# Patient Record
Sex: Male | Born: 1965
Health system: Southern US, Community
[De-identification: ages and names within clinical notes are randomized; demographics above are authoritative.]

## PROBLEM LIST (undated history)

## (undated) DIAGNOSIS — R05 Cough: Secondary | ICD-10-CM

## (undated) DIAGNOSIS — R059 Cough, unspecified: Secondary | ICD-10-CM

## (undated) DIAGNOSIS — I10 Essential (primary) hypertension: Secondary | ICD-10-CM

## (undated) DIAGNOSIS — T7840XA Allergy, unspecified, initial encounter: Secondary | ICD-10-CM

## (undated) DIAGNOSIS — E669 Obesity, unspecified: Secondary | ICD-10-CM

## (undated) DIAGNOSIS — R0602 Shortness of breath: Secondary | ICD-10-CM

## (undated) HISTORY — DX: Allergy, unspecified, initial encounter: T78.40XA

## (undated) HISTORY — PX: WISDOM TOOTH EXTRACTION: SHX21

## (undated) HISTORY — PX: KNEE ARTHROSCOPY: SUR90

## (undated) HISTORY — DX: Cough, unspecified: R05.9

## (undated) HISTORY — DX: Shortness of breath: R06.02

## (undated) HISTORY — DX: Obesity, unspecified: E66.9

## (undated) HISTORY — DX: Essential (primary) hypertension: I10

---
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
## 2011-01-10 ENCOUNTER — Encounter: Payer: Self-pay | Admitting: Family Medicine

## 2011-01-10 ENCOUNTER — Ambulatory Visit (INDEPENDENT_AMBULATORY_CARE_PROVIDER_SITE_OTHER): Payer: PRIVATE HEALTH INSURANCE | Admitting: Family Medicine

## 2011-01-10 DIAGNOSIS — Z0000 Encounter for general adult medical examination without abnormal findings: Secondary | ICD-10-CM

## 2011-01-10 DIAGNOSIS — M109 Gout, unspecified: Secondary | ICD-10-CM

## 2011-01-10 LAB — CBC WITH DIFFERENTIAL/PLATELET
Basophils Absolute: 0 10*3/uL (ref 0.0–0.1)
Basophils Relative: 0.6 % (ref 0.0–3.0)
Eosinophils Absolute: 0.3 10*3/uL (ref 0.0–0.7)
Hemoglobin: 16.8 g/dL (ref 13.0–17.0)
Lymphocytes Relative: 29.4 % (ref 12.0–46.0)
MCHC: 34.5 g/dL (ref 30.0–36.0)
Monocytes Relative: 6.8 % (ref 3.0–12.0)
Neutro Abs: 3.9 10*3/uL (ref 1.4–7.7)
Neutrophils Relative %: 59.2 % (ref 43.0–77.0)
RBC: 5.22 Mil/uL (ref 4.22–5.81)

## 2011-01-10 LAB — POCT URINALYSIS DIPSTICK
Ketones, UA: NEGATIVE
Leukocytes, UA: NEGATIVE
Nitrite, UA: NEGATIVE
Urobilinogen, UA: 0.2
pH, UA: 6

## 2011-01-10 LAB — LIPID PANEL
HDL: 41.9 mg/dL (ref >39.00)
Total CHOL/HDL Ratio: 5
VLDL: 31.4 mg/dL (ref 0.0–40.0)

## 2011-01-10 LAB — BASIC METABOLIC PANEL
Calcium: 8.8 mg/dL (ref 8.4–10.5)
Creatinine, Ser: 1.2 mg/dL (ref 0.4–1.5)
GFR: 66.82 mL/min (ref >60.00)
Sodium: 141 mEq/L (ref 135–145)

## 2011-01-10 LAB — HEPATIC FUNCTION PANEL
AST: 31 U/L (ref 0–37)
Albumin: 4.5 g/dL (ref 3.5–5.2)
Alkaline Phosphatase: 51 U/L (ref 39–117)
Bilirubin, Direct: 0.1 mg/dL (ref 0.0–0.3)

## 2011-01-10 LAB — TSH: TSH: 1.41 u[IU]/mL (ref 0.35–5.50)

## 2011-01-10 MED ORDER — PREDNISONE 20 MG PO TABS: ORAL_TABLET | ORAL | Status: DC

## 2011-01-10 MED ORDER — HYDROCODONE-ACETAMINOPHEN 7.5-750 MG PO TABS: ORAL_TABLET | ORAL | Status: DC

## 2011-01-10 MED ORDER — ALLOPURINOL 100 MG PO TABS: 100.0000 mg | ORAL_TABLET | Freq: Every day | ORAL | Status: DC

## 2011-01-10 NOTE — Patient Instructions (Signed)
I will call you when we get her lab reports back to discuss any medication that we might give you to prevent the gouty attacks.  Return annually for general physical examination  We do see children and families so if the rest of your family would like to see [place] would be happy to do that

## 2011-01-10 NOTE — Progress Notes (Signed)
  Subjective:    Patient ID: Steven Peters, male    DOB: 07/17/1965, 45 y.o.   MRN: [id number]  Jarboe is a 45 year old, married male, nonsmoker, who comes in today as a new patient.  As always been excellent health.  Has never been hospitalized overnight.  He had surgery to his right knee torn cartilage.  Years ago.  He history gout with starting allopurinol, however, when he started ill-appearing all precipitated more gouty attacks.  Therefore, he stopped it.  He's one to two gouty attacks per month.  Review of systems negative except for seasonal allergic rhinitis.  Social history he is married 88 children 41-year-old and 62-year-old twins.  He enjoys playing golf.  He works in [employer].  Family history unknown.  He was adopted.  Immunizations up-to-date because of foreign travel with his job.    Review of Systems  Constitutional: Negative.   HENT: Negative.   Eyes: Negative.   Respiratory: Negative.   Cardiovascular: Negative.   Gastrointestinal: Negative.   Genitourinary: Negative.   Musculoskeletal: Negative.   Skin: Negative.   Neurological: Negative.   Hematological: Negative.   Psychiatric/Behavioral: Negative.        Objective:   Physical Exam  Constitutional: He is oriented to person, place, and time. He appears well-developed and well-nourished.  HENT:  Head: Normocephalic and atraumatic.  Right Ear: External ear normal.  Left Ear: External ear normal.  Nose: Nose normal.  Mouth/Throat: Oropharynx is clear and moist.  Eyes: Conjunctivae and EOM are normal. Pupils are equal, round, and reactive to light.  Neck: Normal range of motion. Neck supple. No JVD present. No tracheal deviation present. No thyromegaly present.  Cardiovascular: Normal rate, regular rhythm, normal heart sounds and intact distal pulses.  Exam reveals no gallop and no friction rub.   No murmur heard. Pulmonary/Chest: Effort normal and breath sounds normal. No stridor. No  respiratory distress. He has no wheezes. He has no rales. He exhibits no tenderness.  Abdominal: Soft. Bowel sounds are normal. He exhibits no distension and no mass. There is no tenderness. There is no rebound and no guarding.  Genitourinary: Rectum normal, prostate normal and penis normal. Guaiac negative stool. No penile tenderness.  Musculoskeletal: Normal range of motion. He exhibits no edema and no tenderness.  Lymphadenopathy:    He has no cervical adenopathy.  Neurological: He is alert and oriented to person, place, and time. He has normal reflexes. No cranial nerve deficit. He exhibits normal muscle tone.  Skin: Skin is warm and dry. No rash noted. No erythema. No pallor.  Psychiatric: He has a normal mood and affect. His behavior is normal. Judgment and thought content normal.          Assessment & Plan:  At healthy male.  Over weight.  History of gout.  Check labs.  Recommend diet, exercise and weight loss.  Return for annual exam

## 2011-01-14 ENCOUNTER — Telehealth: Payer: Self-pay | Admitting: *Deleted

## 2011-01-14 NOTE — Telephone Encounter (Signed)
Pt would like lab results.  

## 2011-01-17 NOTE — Telephone Encounter (Signed)
patient  Is aware and copy mailed to home address

## 2011-01-17 NOTE — Telephone Encounter (Signed)
Please call labs okay, except uric acid, markedly elevated.  He needs to begin taking allopurinol again, one tablet daily forever

## 2011-06-13 ENCOUNTER — Telehealth: Payer: Self-pay

## 2011-06-13 NOTE — Telephone Encounter (Signed)
Pt has been experiencing pain in his left leg when standing or sitting.  Pt states when he is standing it is worse and his toes go numb. This has been going on for 2 to 3 months.   Pt would like to be worked in this week.  Pt can be reached at [phone].

## 2011-06-14 NOTE — Telephone Encounter (Signed)
Appt made for 12/12

## 2011-06-15 ENCOUNTER — Ambulatory Visit (INDEPENDENT_AMBULATORY_CARE_PROVIDER_SITE_OTHER)
Admission: RE | Admit: 2011-06-15 | Discharge: 2011-06-15 | Disposition: A | Discharge status: Home / Self Care | Payer: PRIVATE HEALTH INSURANCE | Source: Ambulatory Visit | Attending: Family Medicine | Admitting: Family Medicine

## 2011-06-15 ENCOUNTER — Encounter: Payer: Self-pay | Admitting: Family Medicine

## 2011-06-15 ENCOUNTER — Ambulatory Visit (INDEPENDENT_AMBULATORY_CARE_PROVIDER_SITE_OTHER): Payer: PRIVATE HEALTH INSURANCE | Admitting: Family Medicine

## 2011-06-15 VITALS — BP 110/80 | Temp 98.0°F | Wt 289.0 lb

## 2011-06-15 DIAGNOSIS — M169 Osteoarthritis of hip, unspecified: Secondary | ICD-10-CM

## 2011-06-15 DIAGNOSIS — M25559 Pain in unspecified hip: Secondary | ICD-10-CM

## 2011-06-15 DIAGNOSIS — M25552 Pain in left hip: Secondary | ICD-10-CM | POA: Insufficient documentation

## 2011-06-15 HISTORY — DX: Pain in left hip: M25.552

## 2011-06-15 NOTE — Progress Notes (Signed)
  Subjective:    Patient ID: Steven Peters, male    DOB: June 09, 1966, 45 y.o.   MRN: [id number]  HPI Clifford is a 45 year old, married male, nonsmoker, who comes in with a two-month history of atraumatic left hip pain.  He states about two months ago he began having pain in his left hip with walking.  When he stops walking.  The pain goes away.  No previous history of trauma.  He did play soccer basketball and tennis in high school.  His right footed.  Review of systems negative.  Family history negative   Review of Systems General an orthopedic review of systems otherwise negative    Objective:   Physical Exam  Well-developed well-nourished, male in no acute distress.  Examination of the right hip shows normal sensation, strength, and reflexes.  However, he is only able to externally rotate his right hip about 45 degrees.  Left leg appears normal.  Sensation, strength, and reflexes.  Normal is only able to elevate the left leg about 30 degrees and very minimal extra rotation      Assessment & Plan:  Degenerative joint disease left hip.  Plan x-ray today.  Begin Motrin or so consult ASAP

## 2011-06-15 NOTE — Patient Instructions (Signed)
Go to the main office now for your x-rays.  Motrin 600 mg twice daily with food.  Call [facility] and make an appointment to see Dr. Cleophas Dunker or Dr. Cleophas Dunker for consultation

## 2013-05-02 ENCOUNTER — Telehealth: Payer: Self-pay | Admitting: Family Medicine

## 2013-05-02 NOTE — Telephone Encounter (Signed)
Spoke with patient and an appointment made 

## 2013-05-02 NOTE — Telephone Encounter (Signed)
Pt has pain going down the back of his leg from waist to foot. Pain has progressively gotten worse over the past 2 wks.  [place] going [place] of country on [place]. Sometimes pain so bad he has to sit down. Wants to come in today/ pls advise

## 2013-05-03 ENCOUNTER — Ambulatory Visit (INDEPENDENT_AMBULATORY_CARE_PROVIDER_SITE_OTHER): Payer: PRIVATE HEALTH INSURANCE | Admitting: Family

## 2013-05-03 ENCOUNTER — Encounter: Payer: Self-pay | Admitting: Family

## 2013-05-03 VITALS — BP 118/72 | HR 94 | Wt 297.0 lb

## 2013-05-03 DIAGNOSIS — M545 Low back pain, unspecified: Secondary | ICD-10-CM

## 2013-05-03 DIAGNOSIS — M5416 Radiculopathy, lumbar region: Secondary | ICD-10-CM

## 2013-05-03 DIAGNOSIS — Z23 Encounter for immunization: Secondary | ICD-10-CM

## 2013-05-03 DIAGNOSIS — IMO0002 Reserved for concepts with insufficient information to code with codable children: Secondary | ICD-10-CM

## 2013-05-03 MED ORDER — PREDNISONE 20 MG PO TABS: ORAL_TABLET | ORAL | Status: AC

## 2013-05-03 NOTE — Patient Instructions (Signed)
Back Exercises These exercises may help you when beginning to rehabilitate your injury. Your symptoms may resolve with or without further involvement from your physician, physical therapist or athletic trainer. While completing these exercises, remember:   Restoring tissue flexibility helps normal motion to return to the joints. This allows healthier, less painful movement and activity.  An effective stretch should be held for at least 30 seconds.  A stretch should never be painful. You should only feel a gentle lengthening or release in the stretched tissue. STRETCH  Extension, Prone on Elbows   Lie on your stomach on the floor, a bed will be too soft. Place your palms about shoulder width apart and at the height of your head.  Place your elbows under your shoulders. If this is too painful, stack pillows under your chest.  Allow your body to relax so that your hips drop lower and make contact more completely with the floor.  Hold this position for __________ seconds.  Slowly return to lying flat on the floor. Repeat __________ times. Complete this exercise __________ times per day.  RANGE OF MOTION  Extension, Prone Press Ups   Lie on your stomach on the floor, a bed will be too soft. Place your palms about shoulder width apart and at the height of your head.  Keeping your back as relaxed as possible, slowly straighten your elbows while keeping your hips on the floor. You may adjust the placement of your hands to maximize your comfort. As you gain motion, your hands will come more underneath your shoulders.  Hold this position __________ seconds.  Slowly return to lying flat on the floor. Repeat __________ times. Complete this exercise __________ times per day.  RANGE OF MOTION- Quadruped, Neutral Spine   Assume a hands and knees position on a firm surface. Keep your hands under your shoulders and your knees under your hips. You may place padding under your knees for comfort.  Drop  your head and point your tail bone toward the ground below you. This will round out your low back like an angry cat. Hold this position for __________ seconds.  Slowly lift your head and release your tail bone so that your back sags into a large arch, like an old horse.  Hold this position for __________ seconds.  Repeat this until you feel limber in your low back.  Now, find your "sweet spot." This will be the most comfortable position somewhere between the two previous positions. This is your neutral spine. Once you have found this position, tense your stomach muscles to support your low back.  Hold this position for __________ seconds. Repeat __________ times. Complete this exercise __________ times per day.  STRETCH  Flexion, Single Knee to Chest   Lie on a firm bed or floor with both legs extended in front of you.  Keeping one leg in contact with the floor, bring your opposite knee to your chest. Hold your leg in place by either grabbing behind your thigh or at your knee.  Pull until you feel a gentle stretch in your low back. Hold __________ seconds.  Slowly release your grasp and repeat the exercise with the opposite side. Repeat __________ times. Complete this exercise __________ times per day.  STRETCH - Hamstrings, Standing  Stand or sit and extend your right / left leg, placing your foot on a chair or foot stool  Keeping a slight arch in your low back and your hips straight forward.  Lead with your chest and   lean forward at the waist until you feel a gentle stretch in the back of your right / left knee or thigh. (When done correctly, this exercise requires leaning only a small distance.)  Hold this position for __________ seconds. Repeat __________ times. Complete this stretch __________ times per day. STRENGTHENING  Deep Abdominals, Pelvic Tilt   Lie on a firm bed or floor. Keeping your legs in front of you, bend your knees so they are both pointed toward the ceiling and  your feet are flat on the floor.  Tense your lower abdominal muscles to press your low back into the floor. This motion will rotate your pelvis so that your tail bone is scooping upwards rather than pointing at your feet or into the floor.  With a gentle tension and even breathing, hold this position for __________ seconds. Repeat __________ times. Complete this exercise __________ times per day.  STRENGTHENING  Abdominals, Crunches   Lie on a firm bed or floor. Keeping your legs in front of you, bend your knees so they are both pointed toward the ceiling and your feet are flat on the floor. Cross your arms over your chest.  Slightly tip your chin down without bending your neck.  Tense your abdominals and slowly lift your trunk high enough to just clear your shoulder blades. Lifting higher can put excessive stress on the low back and does not further strengthen your abdominal muscles.  Control your return to the starting position. Repeat __________ times. Complete this exercise __________ times per day.  STRENGTHENING  Quadruped, Opposite UE/LE Lift   Assume a hands and knees position on a firm surface. Keep your hands under your shoulders and your knees under your hips. You may place padding under your knees for comfort.  Find your neutral spine and gently tense your abdominal muscles so that you can maintain this position. Your shoulders and hips should form a rectangle that is parallel with the floor and is not twisted.  Keeping your trunk steady, lift your right hand no higher than your shoulder and then your left leg no higher than your hip. Make sure you are not holding your breath. Hold this position __________ seconds.  Continuing to keep your abdominal muscles tense and your back steady, slowly return to your starting position. Repeat with the opposite arm and leg. Repeat __________ times. Complete this exercise __________ times per day. Document Released: 07/08/2005 Document  Revised: 09/12/2011 Document Reviewed: 10/02/2008 [employer] Patient Information 2014 [employer].  

## 2013-05-03 NOTE — Progress Notes (Signed)
  Subjective:    Patient ID: Steven Peters, male    DOB: [birth year], 47 y.o.   MRN: [id number]  HPI  47 year old white male, nonsmoker, patient of Dr. Tawanna Cooler is in today with complaints of left lower back pain that radiates down his left leg into his calf x2 weeks and worsening. He rates the pain 8/10, worse with certain movement. He hasn't taken ibuprofen 800 mg that has not helped. The pain is worse this week that had been previously. Patient reports movingonth ago. Denies any history of back pain or injury in the past.  Review of Systems  Constitutional: Negative.   Respiratory: Negative.   Cardiovascular: Negative.   Genitourinary: Negative.   Musculoskeletal: Positive for back pain.       Radiates down the left leg  Skin: Negative.   Neurological: Negative.   Psychiatric/Behavioral: Negative.    Past Medical History  Diagnosis Date  . Allergy   . Gout     History   Social History  . Marital Status: Married    Spouse Name: N/A    Number of Children: N/A  . Years of Education: N/A   Occupational History  . Not on file.   Social History Main Topics  . Smoking status: Former Games developer  . Smokeless tobacco: Not on file  . Alcohol Use: No  . Drug Use: No  . Sexual Activity:    Other Topics Concern  . Not on file   Social History Narrative  . No narrative on file    History reviewed. No pertinent past surgical history.  History reviewed. No pertinent family history.  Not on File  Current Outpatient Prescriptions on File Prior to Visit  Medication Sig Dispense Refill  . allopurinol (ZYLOPRIM) 300 MG tablet Take 300 mg by mouth daily.        Marland Kitchen allopurinol (ZYLOPRIM) 100 MG tablet Take 1 tablet (100 mg total) by mouth daily.  100 tablet  3  . colchicine 0.6 MG tablet Take 0.6 mg by mouth daily.        Marland Kitchen HYDROcodone-acetaminophen (VICODIN ES) 7.5-750 MG per tablet One half to one tablet every 4 to 6 hours p.r.n. For gout  30 tablet  1   No current  facility-administered medications on file prior to visit.    BP 118/72  Pulse 94  Wt 297 lb (134.718 kg)  BMI 41.12 kg/m2chart    Objective:   Physical Exam  Constitutional: He is oriented to person, place, and time. He appears well-developed and well-nourished.  Neck: Normal range of motion. Neck supple. No thyromegaly present.  Cardiovascular: Normal rate, regular rhythm and normal heart sounds.   Pulmonary/Chest: Effort normal and breath sounds normal.  Musculoskeletal: Normal range of motion.  Neurological: He is alert and oriented to person, place, and time.  Skin: Skin is warm and dry.  Psychiatric: He has a normal mood and affect.          Assessment & Plan:  Assessment: 1. Lumbar radiculopathy 2. Low back pain  Plan: Prednisone 60x3, 40x3, 20x3. Consider physical therapy. Low back strengthening exercises to strengthen the core and decreased back pain. In the distant future we could consider an MRI if necessary.

## 2013-09-30 ENCOUNTER — Encounter: Payer: PRIVATE HEALTH INSURANCE | Admitting: Family Medicine

## 2013-10-28 ENCOUNTER — Encounter: Payer: PRIVATE HEALTH INSURANCE | Admitting: Family Medicine

## 2013-11-11 ENCOUNTER — Other Ambulatory Visit: Payer: Self-pay | Admitting: Family

## 2014-03-17 ENCOUNTER — Telehealth: Payer: Self-pay | Admitting: Family Medicine

## 2014-03-17 NOTE — Telephone Encounter (Signed)
ok 

## 2014-03-17 NOTE — Telephone Encounter (Signed)
Fine with me if ok with Dr. Sherren Mocha.

## 2014-03-17 NOTE — Telephone Encounter (Signed)
Pt would like to transfer from Dr. Sherren Mocha to Dr. Yong Channel. Please advise if okay.

## 2014-03-18 NOTE — Telephone Encounter (Signed)
Pt is scheduled °

## 2014-03-31 ENCOUNTER — Ambulatory Visit: Payer: PRIVATE HEALTH INSURANCE | Admitting: Family Medicine

## 2014-08-18 ENCOUNTER — Other Ambulatory Visit (INDEPENDENT_AMBULATORY_CARE_PROVIDER_SITE_OTHER): Payer: PRIVATE HEALTH INSURANCE

## 2014-08-18 DIAGNOSIS — Z0000 Encounter for general adult medical examination without abnormal findings: Secondary | ICD-10-CM

## 2014-08-18 LAB — CBC WITH DIFFERENTIAL/PLATELET
BASOS PCT: 0.6 % (ref 0.0–3.0)
Basophils Absolute: 0 10*3/uL (ref 0.0–0.1)
EOS ABS: 0.2 10*3/uL (ref 0.0–0.7)
EOS PCT: 3.2 % (ref 0.0–5.0)
HCT: 52.3 % — ABNORMAL HIGH (ref 39.0–52.0)
Hemoglobin: 17.9 g/dL — ABNORMAL HIGH (ref 13.0–17.0)
LYMPHS PCT: 28.2 % (ref 12.0–46.0)
Lymphs Abs: 1.9 10*3/uL (ref 0.7–4.0)
MCHC: 34.2 g/dL (ref 30.0–36.0)
MCV: 92.3 fl (ref 78.0–100.0)
MONO ABS: 0.5 10*3/uL (ref 0.1–1.0)
Monocytes Relative: 6.6 % (ref 3.0–12.0)
Neutro Abs: 4.2 10*3/uL (ref 1.4–7.7)
Neutrophils Relative %: 61.4 % (ref 43.0–77.0)
Platelets: 357 10*3/uL (ref 150.0–400.0)
RBC: 5.67 Mil/uL (ref 4.22–5.81)
RDW: 15.1 % (ref 11.5–15.5)
WBC: 6.8 10*3/uL (ref 4.0–10.5)

## 2014-08-18 LAB — BASIC METABOLIC PANEL
BUN: 12 mg/dL (ref 6–23)
CALCIUM: 9.4 mg/dL (ref 8.4–10.5)
CHLORIDE: 104 meq/L (ref 96–112)
CO2: 30 mEq/L (ref 19–32)
CREATININE: 1.17 mg/dL (ref 0.40–1.50)
GFR: 70.36 mL/min (ref >60.00)
GLUCOSE: 82 mg/dL (ref 70–99)
Potassium: 4.1 mEq/L (ref 3.5–5.1)
Sodium: 138 mEq/L (ref 135–145)

## 2014-08-18 LAB — POCT URINALYSIS DIPSTICK
Bilirubin, UA: NEGATIVE
Glucose, UA: NEGATIVE
KETONES UA: NEGATIVE
LEUKOCYTES UA: NEGATIVE
NITRITE UA: NEGATIVE
PROTEIN UA: NEGATIVE
RBC UA: NEGATIVE
Spec Grav, UA: 1.015
Urobilinogen, UA: 0.2
pH, UA: 5.5

## 2014-08-18 LAB — TSH: TSH: 2.39 u[IU]/mL (ref 0.35–4.50)

## 2014-08-18 LAB — HEPATIC FUNCTION PANEL
ALK PHOS: 60 U/L (ref 39–117)
ALT: 25 U/L (ref 0–53)
AST: 18 U/L (ref 0–37)
Albumin: 4.1 g/dL (ref 3.5–5.2)
BILIRUBIN DIRECT: 0.2 mg/dL (ref 0.0–0.3)
BILIRUBIN TOTAL: 0.8 mg/dL (ref 0.2–1.2)
Total Protein: 7.4 g/dL (ref 6.0–8.3)

## 2014-08-18 LAB — LIPID PANEL
Cholesterol: 169 mg/dL (ref 0–200)
HDL: 37.6 mg/dL — ABNORMAL LOW (ref >39.00)
LDL CALC: 105 mg/dL — AB (ref 0–99)
NonHDL: 131.4
Total CHOL/HDL Ratio: 4
Triglycerides: 134 mg/dL (ref 0.0–149.0)
VLDL: 26.8 mg/dL (ref 0.0–40.0)

## 2014-08-26 ENCOUNTER — Ambulatory Visit (INDEPENDENT_AMBULATORY_CARE_PROVIDER_SITE_OTHER): Payer: PRIVATE HEALTH INSURANCE | Admitting: Family Medicine

## 2014-08-26 ENCOUNTER — Encounter: Payer: Self-pay | Admitting: Family Medicine

## 2014-08-26 VITALS — BP 110/84 | Temp 97.9°F | Ht 71.5 in | Wt 299.0 lb

## 2014-08-26 DIAGNOSIS — Z8639 Personal history of other endocrine, nutritional and metabolic disease: Secondary | ICD-10-CM | POA: Diagnosis not present

## 2014-08-26 DIAGNOSIS — Z0000 Encounter for general adult medical examination without abnormal findings: Secondary | ICD-10-CM | POA: Insufficient documentation

## 2014-08-26 DIAGNOSIS — Z8739 Personal history of other diseases of the musculoskeletal system and connective tissue: Secondary | ICD-10-CM

## 2014-08-26 DIAGNOSIS — Z23 Encounter for immunization: Secondary | ICD-10-CM

## 2014-08-26 DIAGNOSIS — D751 Secondary polycythemia: Secondary | ICD-10-CM

## 2014-08-26 HISTORY — DX: Personal history of other diseases of the musculoskeletal system and connective tissue: Z87.39

## 2014-08-26 LAB — IRON: IRON: 69 ug/dL (ref 42–165)

## 2014-08-26 LAB — FERRITIN: Ferritin: 301.3 ng/mL (ref 22.0–322.0)

## 2014-08-26 MED ORDER — ALLOPURINOL 300 MG PO TABS: 300.0000 mg | ORAL_TABLET | Freq: Every day | ORAL | Status: DC

## 2014-08-26 NOTE — Progress Notes (Signed)
   Subjective:    Patient ID: Steven Peters, male    DOB: Nov 25, 1965, 49 y.o.   MRN: [id number]  HPI [name] is a 49 year old nonsmoking male who comes in today for general physical exam because of a history of gout  He takes allopurinol 3 mg daily and he said no gouty attacks in 2 years on this dose.  Does not get routine eye care recommended Dr. Bing Plume. Also needs regular dental care. Colonoscopy at age 43. No family history,,,,,,,,, unknown,,,,,,,,,,,,, adopted  Tetanus booster and flu shot given today,  He is married 59 children 17-year-old twins and 75 year old. He travels he works in a [place]: Negative.   HENT: Negative.   Eyes: Negative.   Respiratory: Negative.   Cardiovascular: Negative.   Gastrointestinal: Negative.   Endocrine: Negative.   Genitourinary: Negative.   Musculoskeletal: Negative.   Skin: Negative.   Allergic/Immunologic: Negative.   Neurological: Negative.   Hematological: Negative.   Psychiatric/Behavioral: Negative.        Objective:   Physical Exam  Constitutional: He is oriented to person, place, and time. He appears well-developed and well-nourished.  HENT:  Head: Normocephalic and atraumatic.  Right Ear: External ear normal.  Left Ear: External ear normal.  Nose: Nose normal.  Mouth/Throat: Oropharynx is clear and moist.  Eyes: Conjunctivae and EOM are normal. Pupils are equal, round, and reactive to light.  Neck: Normal range of motion. Neck supple. No JVD present. No tracheal deviation present. No thyromegaly present.  Cardiovascular: Normal rate, regular rhythm, normal heart sounds and intact distal pulses.  Exam reveals no gallop and no friction rub.   No murmur heard. Pulmonary/Chest: Effort normal and breath sounds normal. No stridor. No respiratory distress. He has no wheezes. He has no rales. He exhibits no tenderness.  Abdominal: Soft. Bowel sounds are normal. He exhibits no distension  and no mass. There is no tenderness. There is no rebound and no guarding.  Genitourinary: Rectum normal, prostate normal and penis normal. Guaiac negative stool. No penile tenderness.  Musculoskeletal: Normal range of motion. He exhibits no edema or tenderness.  Lymphadenopathy:    He has no cervical adenopathy.  Neurological: He is alert and oriented to person, place, and time. He has normal reflexes. No cranial nerve deficit. He exhibits normal muscle tone.  Skin: Skin is warm and dry. No rash noted. No erythema. No pallor.  Psychiatric: He has a normal mood and affect. His behavior is normal. Judgment and thought content normal.  Nursing note and vitals reviewed.         Assessment & Plan:Healthy male    Healthy male  History of gout.... Continue allopurinol  Overweight....... 299 pounds.........Marland Kitchen recommend diet exercise and weight loss follow-up in one year

## 2014-08-26 NOTE — Addendum Note (Signed)
Addended by: Westley Hummer B on: 08/26/2014 05:02 PM   Modules accepted: Orders

## 2014-08-26 NOTE — Patient Instructions (Addendum)
Allopurinol 300 mg.......Marland Kitchen 1 daily  Begin a diet and exercise program........... walk 15 minutes daily........ avoid sugar  Donated a unit of blood every 4 months  Labs today

## 2014-08-26 NOTE — Progress Notes (Signed)
Pre visit review using our clinic review tool, if applicable. No additional management support is needed unless otherwise documented below in the visit note. 

## 2014-08-28 ENCOUNTER — Telehealth: Payer: Self-pay | Admitting: Family Medicine

## 2014-08-28 NOTE — Telephone Encounter (Signed)
Patient is aware 

## 2014-08-28 NOTE — Telephone Encounter (Signed)
Pt returning your call about labs

## 2014-09-01 ENCOUNTER — Ambulatory Visit: Payer: PRIVATE HEALTH INSURANCE | Admitting: Family Medicine

## 2015-02-04 ENCOUNTER — Telehealth: Payer: Self-pay

## 2015-02-04 ENCOUNTER — Encounter: Payer: Self-pay | Admitting: Adult Health

## 2015-02-04 ENCOUNTER — Ambulatory Visit (INDEPENDENT_AMBULATORY_CARE_PROVIDER_SITE_OTHER): Payer: PRIVATE HEALTH INSURANCE | Admitting: Adult Health

## 2015-02-04 ENCOUNTER — Telehealth: Payer: Self-pay | Admitting: Adult Health

## 2015-02-04 VITALS — BP 118/88 | HR 95 | Temp 97.9°F | Ht 71.5 in | Wt 292.1 lb

## 2015-02-04 DIAGNOSIS — R42 Dizziness and giddiness: Secondary | ICD-10-CM | POA: Diagnosis not present

## 2015-02-04 DIAGNOSIS — R531 Weakness: Secondary | ICD-10-CM | POA: Diagnosis not present

## 2015-02-04 LAB — CBC WITH DIFFERENTIAL/PLATELET
BASOS PCT: 0.6 % (ref 0.0–3.0)
Basophils Absolute: 0 10*3/uL (ref 0.0–0.1)
Eosinophils Absolute: 0.3 10*3/uL (ref 0.0–0.7)
Eosinophils Relative: 3.9 % (ref 0.0–5.0)
HCT: 53.2 % — ABNORMAL HIGH (ref 39.0–52.0)
Hemoglobin: 18.3 g/dL (ref 13.0–17.0)
Lymphocytes Relative: 31 % (ref 12.0–46.0)
Lymphs Abs: 2 10*3/uL (ref 0.7–4.0)
MCHC: 34.3 g/dL (ref 30.0–36.0)
MCV: 95.1 fl (ref 78.0–100.0)
Monocytes Absolute: 0.5 10*3/uL (ref 0.1–1.0)
Monocytes Relative: 7.9 % (ref 3.0–12.0)
Neutro Abs: 3.6 10*3/uL (ref 1.4–7.7)
Neutrophils Relative %: 56.6 % (ref 43.0–77.0)
Platelets: 319 10*3/uL (ref 150.0–400.0)
RBC: 5.6 Mil/uL (ref 4.22–5.81)
RDW: 15.2 % (ref 11.5–15.5)
WBC: 6.4 10*3/uL (ref 4.0–10.5)

## 2015-02-04 LAB — BASIC METABOLIC PANEL
BUN: 14 mg/dL (ref 6–23)
CALCIUM: 9.3 mg/dL (ref 8.4–10.5)
CO2: 28 mEq/L (ref 19–32)
Chloride: 103 mEq/L (ref 96–112)
Creatinine, Ser: 1.29 mg/dL (ref 0.40–1.50)
GFR: 62.74 mL/min (ref >60.00)
Glucose, Bld: 104 mg/dL — ABNORMAL HIGH (ref 70–99)
POTASSIUM: 4.2 meq/L (ref 3.5–5.1)
Sodium: 139 mEq/L (ref 135–145)

## 2015-02-04 NOTE — Progress Notes (Signed)
Pre visit review using our clinic review tool, if applicable. No additional management support is needed unless otherwise documented below in the visit note. 

## 2015-02-04 NOTE — Telephone Encounter (Signed)
CRITICAL VALUE STICKER  CRITICAL VALUE: Hemoglobin 18.3     RECEIVER (INITALS): ACM  DATE & TIME NOTIFIED: 8.3.2016 at 10:38 am   MD NOTIFIED: [name], AGNP   TIME OF NOTIFICATION:10:38 am   RESPONSE:

## 2015-02-04 NOTE — Telephone Encounter (Signed)
Noted. He has a history of this. I will wait and see what the rest of his labs show.

## 2015-02-04 NOTE — Patient Instructions (Signed)
It was great meeting you today!  It seems that your dizziness is a result of dehydration. Continue to stay well hydrated. Pick up some Gatorade powder or gummies for your travels.   If your dizziness continues, please let me know

## 2015-02-04 NOTE — Telephone Encounter (Signed)
Spoke to patient regarding his labs.

## 2015-02-04 NOTE — Progress Notes (Signed)
Subjective:    Patient ID: Steven Peters, male    DOB: Mar 14, 1966, 49 y.o.   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 A week later he was in [place] for work, in a [place] and became dizzy and weak again, this happened twice while he was in the [place]. All three episodes happened in high heat areas.   He denies any LOC or syncopal episodes. Denies CP or SOB during episodes. Each episode lasts five minutes.    Review of Systems  HENT: Negative.   Eyes: Negative.   Respiratory: Negative.   Cardiovascular: Negative.   Gastrointestinal: Negative.   Endocrine: Negative.   Genitourinary: Negative.   Musculoskeletal: Negative.   Skin: Negative.   Neurological: Positive for dizziness, weakness and light-headedness. Negative for syncope.  Hematological: Negative.   Psychiatric/Behavioral: Negative.   All other systems reviewed and are negative.  Past Medical History  Diagnosis Date  . Allergy   . Gout     History   Social History  . Marital Status: Married    Spouse Name: N/A  . Number of Children: N/A  . Years of Education: N/A   Occupational History  . Not on file.   Social History Main Topics  . Smoking status: Former Research scientist (life sciences)  . Smokeless tobacco: Not on file  . Alcohol Use: No  . Drug Use: No  . Sexual Activity: Not on file   Other Topics Concern  . Not on file   Social History Narrative    No past surgical history on file.  No family history on file.  No Known Allergies  Current Outpatient Prescriptions on File Prior to Visit  Medication Sig Dispense Refill  . allopurinol (ZYLOPRIM) 300 MG tablet Take 1 tablet (300 mg total) by mouth daily. 100 tablet 3   No current facility-administered medications on file prior to visit.    BP 118/88 mmHg  Pulse 95   Temp(Src) 97.9 F (36.6 C) (Oral)  Ht 5' 11.5" (1.816 m)  Wt 292 lb 1.6 oz (132.496 kg)  BMI 40.18 kg/m2  SpO2 97%       Objective:   Physical Exam  Constitutional: He is oriented to person, place, and time. He appears well-developed and well-nourished. No distress.  obese  HENT:  Head: Normocephalic and atraumatic.  Right Ear: External ear normal.  Left Ear: External ear normal.  Nose: Nose normal.  Mouth/Throat: Oropharynx is clear and moist. No oropharyngeal exudate.  Mild cerumen in bilateral ear canal.   Eyes: Conjunctivae and EOM are normal. Pupils are equal, round, and reactive to light. Right eye exhibits no discharge. Left eye exhibits no discharge.  Neck: Normal range of motion. Neck supple.  Cardiovascular: Normal rate, regular rhythm, normal heart sounds and intact distal pulses.  Exam reveals no gallop and no friction rub.   No murmur heard. Pulmonary/Chest: Effort normal and breath sounds normal. No respiratory distress. He has no wheezes. He has no rales. He exhibits no tenderness.  Musculoskeletal: Normal range of motion. He exhibits no edema or tenderness.  Lymphadenopathy:    He has no cervical adenopathy.  Neurological: He is alert and oriented to person, place, and time.  Skin: Skin is warm and dry. No rash noted. He is not diaphoretic. No erythema. No pallor.  Psychiatric:  He has a normal mood and affect. His behavior is normal. Judgment and thought content normal.  Nursing note and vitals reviewed.     Assessment & Plan:  1. Dizziness - EKG 12-Lead- NSR, rate 75 - Basic metabolic panel - CBC with Differential/Platelet - Keep hydrated - Take Gatorade powder with you on travel assignments - Follow up if no improvement or symptoms persist.  2. Weakness - EKG 98-XQJJ - Basic metabolic panel - CBC with Differential/Platelet

## 2015-10-03 ENCOUNTER — Other Ambulatory Visit: Payer: Self-pay | Admitting: Family Medicine

## 2016-01-28 ENCOUNTER — Other Ambulatory Visit (INDEPENDENT_AMBULATORY_CARE_PROVIDER_SITE_OTHER): Payer: PRIVATE HEALTH INSURANCE

## 2016-01-28 DIAGNOSIS — Z0000 Encounter for general adult medical examination without abnormal findings: Secondary | ICD-10-CM | POA: Diagnosis not present

## 2016-01-28 LAB — HEPATIC FUNCTION PANEL
ALK PHOS: 59 U/L (ref 39–117)
ALT: 28 U/L (ref 0–53)
AST: 22 U/L (ref 0–37)
Albumin: 4.3 g/dL (ref 3.5–5.2)
BILIRUBIN DIRECT: 0.2 mg/dL (ref 0.0–0.3)
BILIRUBIN TOTAL: 0.8 mg/dL (ref 0.2–1.2)
TOTAL PROTEIN: 7.3 g/dL (ref 6.0–8.3)

## 2016-01-28 LAB — CBC WITH DIFFERENTIAL/PLATELET
BASOS ABS: 0 10*3/uL (ref 0.0–0.1)
Basophils Relative: 0.5 % (ref 0.0–3.0)
EOS ABS: 0.3 10*3/uL (ref 0.0–0.7)
Eosinophils Relative: 4.5 % (ref 0.0–5.0)
HEMATOCRIT: 50.7 % (ref 39.0–52.0)
HEMOGLOBIN: 17.4 g/dL — AB (ref 13.0–17.0)
LYMPHS PCT: 27.3 % (ref 12.0–46.0)
Lymphs Abs: 1.8 10*3/uL (ref 0.7–4.0)
MCHC: 34.3 g/dL (ref 30.0–36.0)
MCV: 91.9 fl (ref 78.0–100.0)
MONO ABS: 0.5 10*3/uL (ref 0.1–1.0)
Monocytes Relative: 7.7 % (ref 3.0–12.0)
Neutro Abs: 3.9 10*3/uL (ref 1.4–7.7)
Neutrophils Relative %: 60 % (ref 43.0–77.0)
Platelets: 302 10*3/uL (ref 150.0–400.0)
RBC: 5.51 Mil/uL (ref 4.22–5.81)
RDW: 14.1 % (ref 11.5–15.5)
WBC: 6.5 10*3/uL (ref 4.0–10.5)

## 2016-01-28 LAB — POC URINALSYSI DIPSTICK (AUTOMATED)
Bilirubin, UA: NEGATIVE
Blood, UA: NEGATIVE
Glucose, UA: NEGATIVE
KETONES UA: NEGATIVE
NITRITE UA: NEGATIVE
PROTEIN UA: NEGATIVE
Spec Grav, UA: 1.02
Urobilinogen, UA: 0.2
pH, UA: 5.5

## 2016-01-28 LAB — BASIC METABOLIC PANEL
BUN: 13 mg/dL (ref 6–23)
CALCIUM: 9.5 mg/dL (ref 8.4–10.5)
CHLORIDE: 106 meq/L (ref 96–112)
CO2: 29 mEq/L (ref 19–32)
CREATININE: 1.19 mg/dL (ref 0.40–1.50)
GFR: 68.6 mL/min (ref >60.00)
Glucose, Bld: 96 mg/dL (ref 70–99)
Potassium: 4.7 mEq/L (ref 3.5–5.1)
Sodium: 140 mEq/L (ref 135–145)

## 2016-01-28 LAB — LIPID PANEL
CHOL/HDL RATIO: 4
CHOLESTEROL: 169 mg/dL (ref 0–200)
HDL: 41.4 mg/dL (ref >39.00)
LDL Cholesterol: 108 mg/dL — ABNORMAL HIGH (ref 0–99)
NonHDL: 128.05
TRIGLYCERIDES: 102 mg/dL (ref 0.0–149.0)
VLDL: 20.4 mg/dL (ref 0.0–40.0)

## 2016-01-28 LAB — PSA: PSA: 0.51 ng/mL (ref 0.10–4.00)

## 2016-01-28 LAB — TSH: TSH: 3.01 u[IU]/mL (ref 0.35–4.50)

## 2016-02-02 ENCOUNTER — Encounter: Payer: Self-pay | Admitting: Adult Health

## 2016-02-02 ENCOUNTER — Ambulatory Visit (INDEPENDENT_AMBULATORY_CARE_PROVIDER_SITE_OTHER): Payer: PRIVATE HEALTH INSURANCE | Admitting: Adult Health

## 2016-02-02 VITALS — BP 122/72 | Temp 98.6°F | Ht 71.5 in | Wt 295.5 lb

## 2016-02-02 DIAGNOSIS — Z1211 Encounter for screening for malignant neoplasm of colon: Secondary | ICD-10-CM | POA: Diagnosis not present

## 2016-02-02 DIAGNOSIS — M1A9XX0 Chronic gout, unspecified, without tophus (tophi): Secondary | ICD-10-CM | POA: Diagnosis not present

## 2016-02-02 DIAGNOSIS — Z0000 Encounter for general adult medical examination without abnormal findings: Secondary | ICD-10-CM

## 2016-02-02 DIAGNOSIS — M25562 Pain in left knee: Secondary | ICD-10-CM

## 2016-02-02 DIAGNOSIS — E669 Obesity, unspecified: Secondary | ICD-10-CM | POA: Diagnosis not present

## 2016-02-02 MED ORDER — NAPROXEN 500 MG PO TABS: 500.0000 mg | ORAL_TABLET | Freq: Two times a day (BID) | ORAL | 2 refills | Status: DC

## 2016-02-02 MED ORDER — ALLOPURINOL 300 MG PO TABS: 300.0000 mg | ORAL_TABLET | Freq: Every day | ORAL | 11 refills | Status: DC

## 2016-02-02 NOTE — Patient Instructions (Addendum)
It was great seeing you again!  Your labs look good and overall exam was fine.   The biggest thing for you will be weight loss through diet and exercise. Continue to work at it. Follow up with me in 6 months to see how you are doing.   If you need anything in the meantime, please let me know   Exercising to Lose Weight Exercising can help you to lose weight. In order to lose weight through exercise, you need to do vigorous-intensity exercise. You can tell that you are exercising with vigorous intensity if you are breathing very hard and fast and cannot hold a conversation while exercising. Moderate-intensity exercise helps to maintain your current weight. You can tell that you are exercising at a moderate level if you have a higher heart rate and faster breathing, but you are still able to hold a conversation. HOW OFTEN SHOULD I EXERCISE? Choose an activity that you enjoy and set realistic goals. Your health care provider can help you to make an activity plan that works for you. Exercise regularly as directed by your health care provider. This may include:  Doing resistance training twice each week, such as:  Push-ups.  Sit-ups.  Lifting weights.  Using resistance bands.  Doing a given intensity of exercise for a given amount of time. Choose from these options:  150 minutes of moderate-intensity exercise every week.  75 minutes of vigorous-intensity exercise every week.  A mix of moderate-intensity and vigorous-intensity exercise every week. Children, pregnant women, people who are out of shape, people who are overweight, and older adults may need to consult a health care provider for individual recommendations. If you have any sort of medical condition, be sure to consult your health care provider before starting a new exercise program. WHAT ARE SOME ACTIVITIES THAT CAN HELP ME TO LOSE WEIGHT?   Walking at a rate of at least 4.5 miles an hour.  Jogging or running at a rate of 5  miles per hour.  Biking at a rate of at least 10 miles per hour.  Lap swimming.  Roller-skating or in-line skating.  Cross-country skiing.  Vigorous competitive sports, such as football, basketball, and soccer.  Jumping rope.  Aerobic dancing. HOW CAN I BE MORE ACTIVE IN MY DAY-TO-DAY ACTIVITIES?  Use the stairs instead of the elevator.  Take a walk during your lunch break.  If you drive, park your car farther away from work or school.  If you take public transportation, get off one stop early and walk the rest of the way.  Make all of your phone calls while standing up and walking around.  Get up, stretch, and walk around every 30 minutes throughout the day. WHAT GUIDELINES SHOULD I FOLLOW WHILE EXERCISING?  Do not exercise so much that you hurt yourself, feel dizzy, or get very short of breath.  Consult your health care provider prior to starting a new exercise program.  Wear comfortable clothes and shoes with good support.  Drink plenty of water while you exercise to prevent dehydration or heat stroke. Body water is lost during exercise and must be replaced.  Work out until you breathe faster and your heart beats faster.   This information is not intended to replace advice given to you by your health care provider. Make sure you discuss any questions you have with your health care provider.   Document Released: 07/23/2010 Document Revised: 07/11/2014 Document Reviewed: 11/21/2013 Elsevier Interactive Patient Education [employer].

## 2016-02-02 NOTE — Progress Notes (Signed)
Subjective:    Patient ID: Carbon Treadway, male    DOB: 19-Sep-1965, 50 y.o.   MRN: [id number]  HPI  Patient presents for yearly preventative medicine examination. He is a pleasant   All immunizations and health maintenance protocols were reviewed with the patient and needed orders were placed.  Appropriate screening laboratory values were ordered for the patient including screening of hyperlipidemia, renal function and hepatic function. If indicated by BPH, a PSA was ordered.  Medication reconciliation,  past medical history, social history, problem list and allergies were reviewed in detail with the patient  Goals were established with regard to weight loss, exercise, and  diet in compliance with medications. He understands he needs to lose weight and was trying to exercise until he injured his left knee. He reports his diet as " poor".   His only interval history is that of being seen at a local ER for chest pain after being on a jet ski most of the day. He was diagnosed with a " pulled muscle".   His only acute complaint is that of left knee pain x 2 weeks. He reports that he was playing kick ball on the beach when he injured his knee. He reports pain on the distal side of the knee. More pain with weight bearing and getting up from a seated position. He denies any loss of ROM. He has been using Tylenol and Motrin but has been able to get very little relief form this. Has not been icing.   Review of Systems  Constitutional: Positive for activity change.  HENT: Negative.   Eyes: Negative.   Respiratory: Negative.   Cardiovascular: Negative.   Gastrointestinal: Negative.   Endocrine: Negative.   Genitourinary: Negative.   Musculoskeletal: Positive for arthralgias and myalgias. Negative for gait problem, joint swelling, neck pain and neck stiffness.  Skin: Negative.   Allergic/Immunologic: Negative.   Neurological: Negative.   Hematological: Negative.   Psychiatric/Behavioral:  Negative.   All other systems reviewed and are negative.      Objective:   Physical Exam  Constitutional: He is oriented to person, place, and time. He appears well-developed and well-nourished. No distress.  obese  HENT:  Head: Normocephalic and atraumatic.  Right Ear: External ear normal.  Left Ear: External ear normal.  Nose: Nose normal.  Mouth/Throat: Oropharynx is clear and moist. No oropharyngeal exudate.  Eyes: Conjunctivae and EOM are normal. Pupils are equal, round, and reactive to light. Right eye exhibits no discharge. Left eye exhibits no discharge. No scleral icterus.  Neck: Normal range of motion. Neck supple. No hepatojugular reflux and no JVD present. Carotid bruit is not present. No tracheal deviation present. No thyromegaly present.  Cardiovascular: Normal rate, regular rhythm, normal heart sounds and intact distal pulses.  Exam reveals no gallop and no friction rub.   No murmur heard. Pulmonary/Chest: Effort normal and breath sounds normal. No stridor. No respiratory distress. He has no wheezes. He has no rales. He exhibits no tenderness.  Abdominal: Soft. Bowel sounds are normal. He exhibits no distension. There is no tenderness. There is no rebound and no guarding.  Genitourinary: Rectum normal, prostate normal and penis normal. Rectal exam shows no external hemorrhoid, no internal hemorrhoid, no tenderness, anal tone normal and guaiac negative stool. Prostate is not enlarged and not tender. No penile tenderness.  Musculoskeletal: Normal range of motion.       Left knee: He exhibits swelling. He exhibits normal range of motion, no effusion, no ecchymosis, no  deformity, no erythema, normal alignment, no LCL laxity, normal patellar mobility, no bony tenderness, normal meniscus and no MCL laxity. No tenderness found. No medial joint line, no lateral joint line, no MCL, no LCL and no patellar tendon tenderness noted.       Legs: Lymphadenopathy:    He has no cervical  adenopathy.  Neurological: He is alert and oriented to person, place, and time. He has normal reflexes. He displays normal reflexes. No cranial nerve deficit. He exhibits normal muscle tone. Coordination normal.  Skin: Skin is warm and dry. No rash noted. He is not diaphoretic. No erythema. No pallor.  Psychiatric: He has a normal mood and affect. His behavior is normal. Judgment and thought content normal.  Nursing note and vitals reviewed.     Assessment & Plan:  1. Routine general medical examination at a health care facility - Reviewed labs in detail with patient and all questions answered - Ambulatory referral to Gastroenterology - Needs to diet and exercise 2. Colon cancer screening - Ambulatory referral to Gastroenterology  3. Left knee pain - Likely muscle strain - Knee is stable with no tenderness with palpation  - naproxen (NAPROSYN) 500 MG tablet; Take 1 tablet (500 mg total) by mouth 2 (two) times daily with a meal.  Dispense: 30 tablet; Refill: 2  4. Chronic gout without tophus, unspecified cause, unspecified site - allopurinol (ZYLOPRIM) 300 MG tablet; Take 1 tablet (300 mg total) by mouth daily.  Dispense: 30 tablet; Refill: 11  5. Obesity - Educated on the importance of diet and exercise to reduce stroke, cardiovascular diease, diabetes, etc.  - he understands he needs to lose weight - He would like to follow up in 6 months  - Will consider medication at that time.   [name], NP

## 2016-02-03 ENCOUNTER — Encounter: Payer: Self-pay | Admitting: Gastroenterology

## 2016-03-16 ENCOUNTER — Ambulatory Visit (AMBULATORY_SURGERY_CENTER): Payer: Self-pay

## 2016-03-16 VITALS — Ht 72.0 in | Wt 301.8 lb

## 2016-03-16 DIAGNOSIS — Z1211 Encounter for screening for malignant neoplasm of colon: Secondary | ICD-10-CM

## 2016-03-16 MED ORDER — SUPREP BOWEL PREP KIT 17.5-3.13-1.6 GM/177ML PO SOLN: 1.0000 | Freq: Once | ORAL | 0 refills | Status: AC

## 2016-03-16 NOTE — Progress Notes (Signed)
No allergies to eggs or soy No past problems with anesthesia No diet meds No home oxygen  Declined emmi 

## 2016-04-08 ENCOUNTER — Ambulatory Visit (AMBULATORY_SURGERY_CENTER): Payer: PRIVATE HEALTH INSURANCE | Admitting: Gastroenterology

## 2016-04-08 ENCOUNTER — Encounter: Payer: Self-pay | Admitting: Gastroenterology

## 2016-04-08 VITALS — BP 121/66 | HR 76 | Temp 97.3°F | Resp 14 | Ht 72.0 in | Wt 301.0 lb

## 2016-04-08 DIAGNOSIS — K621 Rectal polyp: Secondary | ICD-10-CM

## 2016-04-08 DIAGNOSIS — D128 Benign neoplasm of rectum: Secondary | ICD-10-CM

## 2016-04-08 DIAGNOSIS — D124 Benign neoplasm of descending colon: Secondary | ICD-10-CM

## 2016-04-08 DIAGNOSIS — Z1211 Encounter for screening for malignant neoplasm of colon: Secondary | ICD-10-CM

## 2016-04-08 DIAGNOSIS — D125 Benign neoplasm of sigmoid colon: Secondary | ICD-10-CM

## 2016-04-08 DIAGNOSIS — D126 Benign neoplasm of colon, unspecified: Secondary | ICD-10-CM | POA: Diagnosis not present

## 2016-04-08 MED ORDER — SODIUM CHLORIDE 0.9 % IV SOLN: 500.0000 mL | INTRAVENOUS | Status: DC

## 2016-04-08 NOTE — Progress Notes (Signed)
Called to room to assist during endoscopic procedure.  Patient ID and intended procedure confirmed with present staff. Received instructions for my participation in the procedure from the performing physician.  

## 2016-04-08 NOTE — Progress Notes (Signed)
Report to PACU, RN, vss, BBS= Clear.  

## 2016-04-08 NOTE — Op Note (Signed)
[facility] Patient Name: Steven Peters Procedure Date: 04/08/2016 8:31 AM MRN: [id number] Endoscopist: Ladene Artist , MD Age: 50 Referring MD:  Date of Birth: [birth year] Gender: Male Account #: [id number] Procedure:                Colonoscopy Indications:              Screening for colorectal malignant neoplasm, This                            is the patient's first colonoscopy Medicines:                Monitored Anesthesia Care Procedure:                Pre-Anesthesia Assessment:                           - Prior to the procedure, a History and Physical                            was performed, and patient medications and                            allergies were reviewed. The patient's tolerance of                            previous anesthesia was also reviewed. The risks                            and benefits of the procedure and the sedation                            options and risks were discussed with the patient.                            All questions were answered, and informed consent                            was obtained. Prior Anticoagulants: The patient has                            taken no previous anticoagulant or antiplatelet                            agents. ASA Grade Assessment: II - A patient with                            mild systemic disease. After reviewing the risks                            and benefits, the patient was deemed in                            satisfactory condition to undergo the procedure.  After obtaining informed consent, the colonoscope                            was passed under direct vision. Throughout the                            procedure, the patient's blood pressure, pulse, and                            oxygen saturations were monitored continuously. The                            Model PCF-[id number]) scope was introduced                            through the anus and  advanced to the the cecum,                            identified by appendiceal orifice and ileocecal                            valve. The ileocecal valve, appendiceal orifice,                            and rectum were photographed. The quality of the                            bowel preparation was good. The colonoscopy was                            performed without difficulty. The patient tolerated                            the procedure well. Scope In: 8:38:47 AM Scope Out: 8:56:32 AM Scope Withdrawal Time: 0 hours 16 minutes 22 seconds  Total Procedure Duration: 0 hours 17 minutes 45 seconds  Findings:                 The perianal and digital rectal examinations were                            normal.                           Two sessile polyps were found in the sigmoid colon                            and descending colon. The polyps were 6 to 7 mm in                            size. These polyps were removed with a cold snare.                            Resection and retrieval were complete.  A few medium-mouthed diverticula were found in the                            sigmoid colon.                           Two sessile polyps were found in the rectum and                            sigmoid colon. The polyps were 4 to 5 mm in size.                            These polyps were removed with a cold biopsy                            forceps. Resection and retrieval were complete.                           The exam was otherwise without abnormality on                            direct and retroflexion views. Complications:            No immediate complications. Estimated blood loss:                            None. Estimated Blood Loss:     Estimated blood loss: none. Impression:               - Two 6 to 7 mm polyps in the sigmoid colon and in                            the descending colon, removed with a cold snare.                            Resected  and retrieved.                           - Diverticulosis in the sigmoid colon.                           - Two 4 to 5 mm polyps in the rectum and in the                            sigmoid colon, removed with a cold biopsy forceps.                            Resected and retrieved.                           - The examination was otherwise normal on direct                            and retroflexion views. Recommendation:           -  Repeat colonoscopy in 5 years for surveillance if                            polyps are precancerous, otherwise 10 years for                            screening.                           - Patient has a contact number available for                            emergencies. The signs and symptoms of potential                            delayed complications were discussed with the                            patient. Return to normal activities tomorrow.                            Written discharge instructions were provided to the                            patient.                           - Resume previous diet.                           - Continue present medications.                           - Await pathology results. Ladene Artist, MD 04/08/2016 9:00:25 AM This report has been signed electronically.

## 2016-04-08 NOTE — Patient Instructions (Signed)
YOU HAD AN ENDOSCOPIC PROCEDURE TODAY AT [facility]:   Refer to the procedure report that was given to you for any specific questions about what was found during the examination.  If the procedure report does not answer your questions, please call your gastroenterologist to clarify.  If you requested that your care partner not be given the details of your procedure findings, then the procedure report has been included in a sealed envelope for you to review at your convenience later.  YOU SHOULD EXPECT: Some feelings of bloating in the abdomen. Passage of more gas than usual.  Walking can help get rid of the air that was put into your GI tract during the procedure and reduce the bloating. If you had a lower endoscopy (such as a colonoscopy or flexible sigmoidoscopy) you may notice spotting of blood in your stool or on the toilet paper. If you underwent a bowel prep for your procedure, you may not have a normal bowel movement for a few days.  Please Note:  You might notice some irritation and congestion in your nose or some drainage.  This is from the oxygen used during your procedure.  There is no need for concern and it should clear up in a day or so.  SYMPTOMS TO REPORT IMMEDIATELY:   Following lower endoscopy (colonoscopy or flexible sigmoidoscopy):  Excessive amounts of blood in the stool  Significant tenderness or worsening of abdominal pains  Swelling of the abdomen that is new, acute  Fever of 100F or higher   Following upper endoscopy (EGD)  Vomiting of blood or coffee ground material  New chest pain or pain under the shoulder blades  Painful or persistently difficult swallowing  New shortness of breath  Fever of 100F or higher  Black, tarry-looking stools  For urgent or emergent issues, a gastroenterologist can be reached at any hour by calling [phone].   DIET:  We do recommend a small meal at first, but then you may proceed to your regular diet.  Drink  plenty of fluids but you should avoid alcoholic beverages for 24 hours.  ACTIVITY:  You should plan to take it easy for the rest of today and you should NOT DRIVE or use heavy machinery until tomorrow (because of the sedation medicines used during the test).    FOLLOW UP: Our staff will call the number listed on your records the next business day following your procedure to check on you and address any questions or concerns that you may have regarding the information given to you following your procedure. If we do not reach you, we will leave a message.  However, if you are feeling well and you are not experiencing any problems, there is no need to return our call.  We will assume that you have returned to your regular daily activities without incident.  If any biopsies were taken you will be contacted by phone or by letter within the next 1-3 weeks.  Please call us at [phone] if you have not heard about the biopsies in 3 weeks.    SIGNATURES/CONFIDENTIALITY: You and/or your care partner have signed paperwork which will be entered into your electronic medical record.  These signatures attest to the fact that that the information above on your After Visit Summary has been reviewed and is understood.  Full responsibility of the confidentiality of this discharge information lies with you and/or your care-partner.  Polyp, diverticulosis, high fiber diet information given.

## 2016-04-11 ENCOUNTER — Telehealth: Payer: Self-pay

## 2016-04-11 NOTE — Telephone Encounter (Signed)
  Follow up Call-  Call back number 04/08/2016  Post procedure Call Back phone  # [phone]  Permission to leave phone message Yes  Some recent data might be hidden     Patient questions:  Do you have a fever, pain , or abdominal swelling? No. Pain Score  0 *  Have you tolerated food without any problems? Yes.    Have you been able to return to your normal activities? Yes.    Do you have any questions about your discharge instructions: Diet   No. Medications  No. Follow up visit  No.  Do you have questions or concerns about your Care? No.  Actions: * If pain score is 4 or above: No action needed, pain <4.

## 2016-04-11 NOTE — Telephone Encounter (Signed)
  Follow up Call-  Call back number 04/08/2016  Post procedure Call Back phone  # [phone]  Permission to leave phone message Yes  Some recent data might be hidden    Patient was called for follow up after his procedure on 04/08/2016. No answer at the number given for follow up phone call. A message was left on the answering machine. This was the second attempt to contact the patient.

## 2016-04-11 NOTE — Telephone Encounter (Signed)
Left message on answering machine. 

## 2016-04-18 ENCOUNTER — Encounter: Payer: Self-pay | Admitting: Gastroenterology

## 2016-04-22 ENCOUNTER — Other Ambulatory Visit: Payer: Self-pay | Admitting: Adult Health

## 2016-04-22 DIAGNOSIS — M25562 Pain in left knee: Secondary | ICD-10-CM

## 2016-04-22 NOTE — Telephone Encounter (Signed)
Ok to refill 

## 2016-04-22 NOTE — Telephone Encounter (Signed)
Ok to refill for 6 months 

## 2016-05-02 ENCOUNTER — Telehealth: Payer: Self-pay | Admitting: Gastroenterology

## 2016-05-02 NOTE — Telephone Encounter (Signed)
Patient with a recent colonoscopy 04/08/16  and polypectomy x 4 with cold snare removal of 2 from the sigmoid colon and 2 from the rectum using cold biopsy forceps.  He reports blood in stool on Friday and Saturday.  No bleeding yesterday or today.  He is reporting rectal itching and burning as well.  Hemorrhoids are not mentioned on procedure report.  He is leaving the country for 3 weeks next week.  Dr. Fuller Plan please advise if needs additional treatment.

## 2016-05-02 NOTE — Telephone Encounter (Signed)
Patient notified. He will call back if symptoms persist

## 2016-05-02 NOTE — Telephone Encounter (Signed)
All polyps removed without cautery so bleeding 3 week out from polypectomies would be very very unlikely. No hemorrhoids noted at colonoscopy but he could have small hemorrhoids. Could have had a mild diverticular bleed. No ASA/NSAIDs for 1 week. Trial of Prep H supp bid for 5 days and then prn and witch hazel bid topically for itching prn. If bleeding recurs or itching doesn't resolve he should be seen by APP.

## 2016-06-08 ENCOUNTER — Other Ambulatory Visit: Payer: Self-pay | Admitting: *Deleted

## 2017-02-07 ENCOUNTER — Telehealth: Payer: Self-pay | Admitting: Family Medicine

## 2017-02-07 ENCOUNTER — Other Ambulatory Visit: Payer: Self-pay | Admitting: Adult Health

## 2017-02-07 DIAGNOSIS — M1A9XX0 Chronic gout, unspecified, without tophus (tophi): Secondary | ICD-10-CM

## 2017-02-07 NOTE — Telephone Encounter (Signed)
Sent to the pharmacy by e-scribe for 30 days.  Pt now due for yearly and lab work.  Message sent to scheduling.

## 2017-02-07 NOTE — Telephone Encounter (Signed)
Pt now due for cpx and lab work with [facility].  Please help the pt to make an appointment and come fasting.  Thanks!!

## 2017-02-14 NOTE — Telephone Encounter (Signed)
Pt is in [place] right now on business.  Will not return until September. Pt is scheduled 9/25 at 7 am.

## 2017-03-14 ENCOUNTER — Other Ambulatory Visit: Payer: Self-pay | Admitting: Adult Health

## 2017-03-14 DIAGNOSIS — M1A9XX0 Chronic gout, unspecified, without tophus (tophi): Secondary | ICD-10-CM

## 2017-03-28 ENCOUNTER — Encounter: Payer: Self-pay | Admitting: Adult Health

## 2017-03-28 ENCOUNTER — Ambulatory Visit (INDEPENDENT_AMBULATORY_CARE_PROVIDER_SITE_OTHER): Payer: PRIVATE HEALTH INSURANCE | Admitting: Adult Health

## 2017-03-28 VITALS — BP 124/84 | HR 77 | Temp 98.6°F | Ht 72.0 in | Wt 283.0 lb

## 2017-03-28 DIAGNOSIS — M1A9XX0 Chronic gout, unspecified, without tophus (tophi): Secondary | ICD-10-CM | POA: Diagnosis not present

## 2017-03-28 DIAGNOSIS — Z23 Encounter for immunization: Secondary | ICD-10-CM | POA: Diagnosis not present

## 2017-03-28 DIAGNOSIS — Z0000 Encounter for general adult medical examination without abnormal findings: Secondary | ICD-10-CM | POA: Diagnosis not present

## 2017-03-28 DIAGNOSIS — Z125 Encounter for screening for malignant neoplasm of prostate: Secondary | ICD-10-CM | POA: Diagnosis not present

## 2017-03-28 DIAGNOSIS — E669 Obesity, unspecified: Secondary | ICD-10-CM

## 2017-03-28 LAB — BASIC METABOLIC PANEL
BUN: 15 mg/dL (ref 6–23)
CO2: 28 mEq/L (ref 19–32)
Calcium: 9.5 mg/dL (ref 8.4–10.5)
Chloride: 103 mEq/L (ref 96–112)
Creatinine, Ser: 1.09 mg/dL (ref 0.40–1.50)
GFR: 75.56 mL/min (ref >60.00)
GLUCOSE: 83 mg/dL (ref 70–99)
POTASSIUM: 4.5 meq/L (ref 3.5–5.1)
SODIUM: 139 meq/L (ref 135–145)

## 2017-03-28 LAB — CBC WITH DIFFERENTIAL/PLATELET
Basophils Absolute: 0 10*3/uL (ref 0.0–0.1)
Basophils Relative: 0.7 % (ref 0.0–3.0)
EOS PCT: 3.4 % (ref 0.0–5.0)
Eosinophils Absolute: 0.2 10*3/uL (ref 0.0–0.7)
HCT: 49.6 % (ref 39.0–52.0)
Hemoglobin: 16.8 g/dL (ref 13.0–17.0)
LYMPHS ABS: 1.7 10*3/uL (ref 0.7–4.0)
Lymphocytes Relative: 29.7 % (ref 12.0–46.0)
MCHC: 33.9 g/dL (ref 30.0–36.0)
MCV: 93.2 fl (ref 78.0–100.0)
MONO ABS: 0.4 10*3/uL (ref 0.1–1.0)
MONOS PCT: 7.7 % (ref 3.0–12.0)
NEUTROS ABS: 3.3 10*3/uL (ref 1.4–7.7)
Neutrophils Relative %: 58.5 % (ref 43.0–77.0)
PLATELETS: 290 10*3/uL (ref 150.0–400.0)
RBC: 5.32 Mil/uL (ref 4.22–5.81)
RDW: 14.1 % (ref 11.5–15.5)
WBC: 5.7 10*3/uL (ref 4.0–10.5)

## 2017-03-28 LAB — HEPATIC FUNCTION PANEL
ALBUMIN: 4.3 g/dL (ref 3.5–5.2)
ALT: 29 U/L (ref 0–53)
AST: 20 U/L (ref 0–37)
Alkaline Phosphatase: 55 U/L (ref 39–117)
BILIRUBIN TOTAL: 0.9 mg/dL (ref 0.2–1.2)
Bilirubin, Direct: 0.2 mg/dL (ref 0.0–0.3)
Total Protein: 7 g/dL (ref 6.0–8.3)

## 2017-03-28 LAB — TSH: TSH: 2.99 u[IU]/mL (ref 0.35–4.50)

## 2017-03-28 LAB — LIPID PANEL
Cholesterol: 153 mg/dL (ref 0–200)
HDL: 37.7 mg/dL — AB (ref >39.00)
LDL CALC: 91 mg/dL (ref 0–99)
NonHDL: 115.79
TRIGLYCERIDES: 124 mg/dL (ref 0.0–149.0)
Total CHOL/HDL Ratio: 4
VLDL: 24.8 mg/dL (ref 0.0–40.0)

## 2017-03-28 LAB — PSA: PSA: 0.47 ng/mL (ref 0.10–4.00)

## 2017-03-28 MED ORDER — ALLOPURINOL 300 MG PO TABS: 300.0000 mg | ORAL_TABLET | Freq: Every day | ORAL | 3 refills | Status: DC

## 2017-03-28 NOTE — Addendum Note (Signed)
Addended by: Kateri Mc E on: 03/28/2017 07:35 AM   Modules accepted: Orders

## 2017-03-28 NOTE — Progress Notes (Signed)
Subjective:    Patient ID: Steven Peters, male    DOB: 04-19-1966, 51 y.o.   MRN: [id number]  HPI  Patient presents for yearly preventative medicine examination. He is a pleasant 51 year old male who  has a past medical history of Allergy and Gout.  Due to history of gout, he takes Allopurinol 300 mg daily. He has not had any recent gout flares.   All immunizations and health maintenance protocols were reviewed with the patient and needed orders were placed. He is due for flu vaccination   Appropriate screening laboratory values were ordered for the patient including screening of hyperlipidemia, renal function and hepatic function. If indicated by BPH, a PSA was ordered.  Medication reconciliation,  past medical history, social history, problem list and allergies were reviewed in detail with the patient  Goals were established with regard to weight loss, exercise, and  diet in compliance with medications. He reports that he has been using nutrtasystem and has noticed weight loss through this. His exercise is limited due to a torn meniscus in left knee, which he is currently being seen by Weston Anna.   Wt Readings from Last 3 Encounters:  03/28/17 283 lb (128.4 kg)  04/08/16 (!) 301 lb (136.5 kg)  03/16/16 (!) 301 lb 12.8 oz (136.9 kg)     He is up to date on his colonoscopy, dental and vision screens.   Review of Systems  Constitutional: Negative.   HENT: Negative.   Eyes: Negative.   Respiratory: Negative.   Cardiovascular: Negative.   Gastrointestinal: Negative.   Endocrine: Negative.   Genitourinary: Negative.   Musculoskeletal: Positive for arthralgias.  Skin: Negative.   Allergic/Immunologic: Negative.   Neurological: Negative.   Hematological: Negative.   Psychiatric/Behavioral: Negative.   All other systems reviewed and are negative.  Past Medical History:  Diagnosis Date  . Allergy   . Gout     Social History   Social History  . Marital status:  Married    Spouse name: N/A  . Number of children: N/A  . Years of education: N/A   Occupational History  . Not on file.   Social History Main Topics  . Smoking status: Former Research scientist (life sciences)  . Smokeless tobacco: Never Used  . Alcohol use 1.2 oz/week    2 Shots of liquor per week     Comment: weekends only  . Drug use: No  . Sexual activity: Not on file   Other Topics Concern  . Not on file   Social History Narrative  . No narrative on file    Past Surgical History:  Procedure Laterality Date  . KNEE ARTHROSCOPY     right knee  . WISDOM TOOTH EXTRACTION      Family History  Problem Relation Age of Onset  . Adopted: Yes  . Colon cancer Neg Hx     No Known Allergies  No current outpatient prescriptions on file prior to visit.   No current facility-administered medications on file prior to visit.     BP 124/84   Pulse 77   Temp 98.6 F (37 C)   Ht 6' (1.829 m)   Wt 283 lb (128.4 kg)   SpO2 98%   BMI 38.38 kg/m       Objective:   Physical Exam  Constitutional: He is oriented to person, place, and time. He appears well-developed and well-nourished. No distress.  Obese   HENT:  Head: Normocephalic and atraumatic.  Right Ear: External ear normal.  Left Ear: External ear normal.  Nose: Nose normal.  Mouth/Throat: Oropharynx is clear and moist. No oropharyngeal exudate.  Eyes: Pupils are equal, round, and reactive to light. Conjunctivae and EOM are normal. Right eye exhibits no discharge. Left eye exhibits no discharge. No scleral icterus.  Neck: Trachea normal and normal range of motion. Neck supple. No JVD present. Carotid bruit is not present. No tracheal deviation present. No thyroid mass and no thyromegaly present.  Cardiovascular: Normal rate, regular rhythm, normal heart sounds and intact distal pulses.  Exam reveals no gallop and no friction rub.   No murmur heard. Pulmonary/Chest: Effort normal and breath sounds normal. No stridor. No respiratory  distress. He has no wheezes. He has no rales. He exhibits no tenderness.  Abdominal: Soft. Bowel sounds are normal. He exhibits no distension and no mass. There is no tenderness. There is no rebound and no guarding.  Genitourinary: Rectum normal and prostate normal.  Musculoskeletal: Normal range of motion. He exhibits no edema, tenderness or deformity.  Lymphadenopathy:    He has no cervical adenopathy.  Neurological: He is alert and oriented to person, place, and time. He has normal reflexes. He displays normal reflexes. No cranial nerve deficit. He exhibits normal muscle tone. Coordination normal.  Skin: Skin is warm and dry. No rash noted. He is not diaphoretic. No erythema. No pallor.  Scattered cherry angiomas   Psychiatric: He has a normal mood and affect. His behavior is normal. Judgment and thought content normal.  Nursing note and vitals reviewed.      Assessment & Plan:  1. Routine general medical examination at a health care facility - Flu vaccination given today  - Basic metabolic panel - CBC with Differential/Platelet - Hepatic function panel - Lipid panel - TSH - PSA  2. Chronic gout without tophus, unspecified cause, unspecified site  - allopurinol (ZYLOPRIM) 300 MG tablet; Take 1 tablet (300 mg total) by mouth daily.  Dispense: 90 tablet; Refill: 3  3. Obesity (BMI 30-39.9) - Continue with diet.  - Advised low impact exercises such as swimming to help with weight loss.    [name], NP

## 2017-04-04 ENCOUNTER — Telehealth: Payer: Self-pay | Admitting: Adult Health

## 2017-04-04 NOTE — Telephone Encounter (Signed)
Pt is returning misty call °

## 2017-04-04 NOTE — Telephone Encounter (Signed)
Pt notified of results by telephone.  See result note. 

## 2017-05-24 ENCOUNTER — Encounter (HOSPITAL_BASED_OUTPATIENT_CLINIC_OR_DEPARTMENT_OTHER): Payer: Self-pay

## 2017-05-24 ENCOUNTER — Other Ambulatory Visit: Payer: Self-pay

## 2017-05-24 ENCOUNTER — Emergency Department (HOSPITAL_BASED_OUTPATIENT_CLINIC_OR_DEPARTMENT_OTHER)
Admission: EM | Admit: 2017-05-24 | Discharge: 2017-05-24 | Disposition: A | Discharge status: Home / Self Care | Payer: PRIVATE HEALTH INSURANCE | Attending: Emergency Medicine | Admitting: Emergency Medicine

## 2017-05-24 ENCOUNTER — Emergency Department (HOSPITAL_BASED_OUTPATIENT_CLINIC_OR_DEPARTMENT_OTHER): Payer: PRIVATE HEALTH INSURANCE

## 2017-05-24 DIAGNOSIS — Y929 Unspecified place or not applicable: Secondary | ICD-10-CM | POA: Insufficient documentation

## 2017-05-24 DIAGNOSIS — S8392XA Sprain of unspecified site of left knee, initial encounter: Secondary | ICD-10-CM | POA: Diagnosis not present

## 2017-05-24 DIAGNOSIS — Y9389 Activity, other specified: Secondary | ICD-10-CM | POA: Insufficient documentation

## 2017-05-24 DIAGNOSIS — Z87891 Personal history of nicotine dependence: Secondary | ICD-10-CM | POA: Insufficient documentation

## 2017-05-24 DIAGNOSIS — Y998 Other external cause status: Secondary | ICD-10-CM | POA: Insufficient documentation

## 2017-05-24 DIAGNOSIS — X509XXA Other and unspecified overexertion or strenuous movements or postures, initial encounter: Secondary | ICD-10-CM | POA: Insufficient documentation

## 2017-05-24 DIAGNOSIS — Z79899 Other long term (current) drug therapy: Secondary | ICD-10-CM | POA: Diagnosis not present

## 2017-05-24 DIAGNOSIS — S8992XA Unspecified injury of left lower leg, initial encounter: Secondary | ICD-10-CM | POA: Diagnosis present

## 2017-05-24 MED ORDER — HYDROCODONE-ACETAMINOPHEN 5-325 MG PO TABS
2.0000 | ORAL_TABLET | Freq: Once | ORAL | Status: AC
  Administered 2017-05-24: 2 via ORAL
  Filled 2017-05-24: qty 2

## 2017-05-24 NOTE — ED Notes (Signed)
ED Provider at bedside. 

## 2017-05-24 NOTE — ED Provider Notes (Signed)
[facility] Provider Note   CSN: [id number] Arrival date & time: 05/24/17  0703     History   Chief Complaint Chief Complaint  Patient presents with  . Knee Pain    HPI Steven Peters is a 51 y.o. male hx of gout, meniscus tear in L knee here with L knee pain. He bent over yesterday and stood up suddenly and felt sudden pain on the left knee. He denies any fall or injury. He states that his left knee is more swollen and he had hard time walking up stairs but was able to bear some weight on it. He has hx of gout in the foot but never in the knee. He is scheduled for arthroscopy of the left knee in a month for known meniscus tear. Took ibuprofen last night with no relief.   The history is provided by the patient.    Past Medical History:  Diagnosis Date  . Allergy   . Gout     Patient Active Problem List   Diagnosis Date Noted  . History of gout 08/26/2014  . Routine general medical examination at a health care facility 08/26/2014  . Left hip pain 06/15/2011    Past Surgical History:  Procedure Laterality Date  . KNEE ARTHROSCOPY     right knee  . WISDOM TOOTH EXTRACTION         Home Medications    Prior to Admission medications   Medication Sig Start Date End Date Taking? Authorizing Provider  allopurinol (ZYLOPRIM) 300 MG tablet Take 1 tablet (300 mg total) by mouth daily. 03/28/17   [name], NP    Family History Family History  Adopted: Yes  Problem Relation Age of Onset  . Colon cancer Neg Hx     Social History Social History   Tobacco Use  . Smoking status: Former Research scientist (life sciences)  . Smokeless tobacco: Never Used  Substance Use Topics  . Alcohol use: Yes    Alcohol/week: 1.2 oz    Types: 2 Shots of liquor per week    Comment: weekends only  . Drug use: No     Allergies   Patient has no known allergies.   Review of Systems Review of Systems  Musculoskeletal:       L knee pain   All other systems reviewed  and are negative.    Physical Exam Updated Vital Signs BP (!) 150/107 (BP Location: Right Arm)   Pulse 86   Temp 98.1 F (36.7 C) (Oral)   Resp 18   Ht 6' (1.829 m)   Wt 127 kg (280 lb)   SpO2 99%   BMI 37.97 kg/m   Physical Exam  Constitutional: He is oriented to person, place, and time. He appears well-developed.  HENT:  Head: Normocephalic.  Eyes: Pupils are equal, round, and reactive to light.  Neck: Normal range of motion.  Cardiovascular: Normal rate.  Pulmonary/Chest: Effort normal.  Abdominal: Soft.  Musculoskeletal:  L knee minimal swelling, nl ROM. Mild tenderness lateral aspect. ACL/PCL stable. Neurovascular intact L lower extremity   Neurological: He is alert and oriented to person, place, and time.  Skin: Skin is warm.  Psychiatric: He has a normal mood and affect.  Nursing note and vitals reviewed.    ED Treatments / Results  Labs (all labs ordered are listed, but only abnormal results are displayed) Labs Reviewed - No data to display  EKG  EKG Interpretation None       Radiology Dg  Knee Complete 4 Views Left  Result Date: 05/24/2017 CLINICAL DATA:  Pain after prolonged standing EXAM: LEFT KNEE - COMPLETE 4+ VIEW COMPARISON:  Left knee MRI October 04, 2016 FINDINGS: Frontal, lateral, and bilateral oblique views were obtained. There is no evident fracture or dislocation. There is no appreciable knee joint effusion. There is moderate narrowing of the patellofemoral joint with spurring in all compartments. There is no appreciable narrowing medially or laterally. No erosive change. IMPRESSION: Spurring in all compartments with moderate narrowing of the patellofemoral joint. No fracture or joint effusion evident. Electronically Signed   By: Lowella Grip III M.D.   On: 05/24/2017 08:02    Procedures Procedures (including critical care time)  Medications Ordered in ED Medications  HYDROcodone-acetaminophen (NORCO/VICODIN) 5-325 MG per tablet 2  tablet (2 tablets Oral Given 05/24/17 0729)     Initial Impression / Assessment and Plan / ED Course  I have reviewed the triage vital signs and the nursing notes.  Pertinent labs & imaging results that were available during my care of the patient were reviewed by me and considered in my medical decision making (see chart for details).     Steven Peters is a 51 y.o. male here with L knee pain. Has known meniscus tear and likely has pain from that vs near tear or ligamentous strain. L knee slightly swollen but not hot or red and I doubt septic joint or gout flare. Will get xrays, give vicodin for pain. Has arthoscopy scheduled already.   8:10 AM Xray showed arthritis. Pain improved with vicodin. Xray showed arthritis, no fractures. Given knee immobilizer, crutches. Doesn't want prescription for pain meds. He will call Dr. Debroah Loop office today for appointment.   Final Clinical Impressions(s) / ED Diagnoses   Final diagnoses:  None    ED Discharge Orders    None       Drenda Freeze, MD 05/24/17 [id number]

## 2017-05-24 NOTE — ED Notes (Signed)
Pt to XR at this time

## 2017-05-24 NOTE — Discharge Instructions (Signed)
Call Dr. Debroah Loop office today for appointment   Take tylenol, motrin for pain.   Apply ice for swelling.   Return to ER if you have worse knee swelling or pain, unable to walk.

## 2017-05-24 NOTE — ED Triage Notes (Signed)
Pt reports pain to left knee since last night while bending over. Swelling to left knee.

## 2017-05-24 NOTE — ED Notes (Signed)
Pt. returned from XR. 

## 2017-06-09 ENCOUNTER — Ambulatory Visit (HOSPITAL_COMMUNITY)
Admission: RE | Admit: 2017-06-09 | Discharge: 2017-06-09 | Disposition: A | Discharge status: Home / Self Care | Payer: PRIVATE HEALTH INSURANCE | Source: Ambulatory Visit | Attending: Physician Assistant | Admitting: Physician Assistant

## 2017-06-09 ENCOUNTER — Other Ambulatory Visit (HOSPITAL_COMMUNITY): Payer: Self-pay | Admitting: Orthopedic Surgery

## 2017-06-09 DIAGNOSIS — M7989 Other specified soft tissue disorders: Secondary | ICD-10-CM | POA: Diagnosis not present

## 2017-06-09 DIAGNOSIS — M79605 Pain in left leg: Secondary | ICD-10-CM

## 2017-06-09 DIAGNOSIS — M7918 Myalgia, other site: Secondary | ICD-10-CM | POA: Insufficient documentation

## 2017-06-09 NOTE — Progress Notes (Signed)
*  PRELIMINARY RESULTS* Vascular Ultrasound Left lower extremity venous duplex has been completed.  Preliminary findings: No evidence of deep vein thrombosis or baker's cysts in the left lower extremity.  Preliminary report called to office @ 16:00.   Everrett Coombe 06/09/2017, 3:59 PM

## 2017-08-08 DIAGNOSIS — M25562 Pain in left knee: Secondary | ICD-10-CM | POA: Diagnosis not present

## 2017-10-12 DIAGNOSIS — M25562 Pain in left knee: Secondary | ICD-10-CM | POA: Diagnosis not present

## 2017-10-12 DIAGNOSIS — M25462 Effusion, left knee: Secondary | ICD-10-CM | POA: Diagnosis not present

## 2017-12-05 DIAGNOSIS — M25562 Pain in left knee: Secondary | ICD-10-CM | POA: Diagnosis not present

## 2018-02-23 DIAGNOSIS — M25662 Stiffness of left knee, not elsewhere classified: Secondary | ICD-10-CM | POA: Diagnosis not present

## 2018-03-29 ENCOUNTER — Ambulatory Visit (INDEPENDENT_AMBULATORY_CARE_PROVIDER_SITE_OTHER): Payer: BLUE CROSS/BLUE SHIELD | Admitting: Adult Health

## 2018-03-29 ENCOUNTER — Encounter: Payer: Self-pay | Admitting: Adult Health

## 2018-03-29 VITALS — BP 132/80 | HR 88 | Temp 97.7°F | Ht 71.25 in | Wt 276.8 lb

## 2018-03-29 DIAGNOSIS — Z8739 Personal history of other diseases of the musculoskeletal system and connective tissue: Secondary | ICD-10-CM | POA: Diagnosis not present

## 2018-03-29 DIAGNOSIS — Z0000 Encounter for general adult medical examination without abnormal findings: Secondary | ICD-10-CM | POA: Diagnosis not present

## 2018-03-29 DIAGNOSIS — M1A9XX0 Chronic gout, unspecified, without tophus (tophi): Secondary | ICD-10-CM

## 2018-03-29 DIAGNOSIS — Z125 Encounter for screening for malignant neoplasm of prostate: Secondary | ICD-10-CM | POA: Diagnosis not present

## 2018-03-29 DIAGNOSIS — Z114 Encounter for screening for human immunodeficiency virus [HIV]: Secondary | ICD-10-CM | POA: Diagnosis not present

## 2018-03-29 DIAGNOSIS — Z23 Encounter for immunization: Secondary | ICD-10-CM

## 2018-03-29 DIAGNOSIS — M25662 Stiffness of left knee, not elsewhere classified: Secondary | ICD-10-CM | POA: Diagnosis not present

## 2018-03-29 LAB — CBC WITH DIFFERENTIAL/PLATELET
Basophils Absolute: 0 10*3/uL (ref 0.0–0.1)
Basophils Relative: 0.6 % (ref 0.0–3.0)
Eosinophils Absolute: 0.2 10*3/uL (ref 0.0–0.7)
Eosinophils Relative: 2.6 % (ref 0.0–5.0)
HCT: 51.9 % (ref 39.0–52.0)
Hemoglobin: 17.7 g/dL — ABNORMAL HIGH (ref 13.0–17.0)
LYMPHS PCT: 26.4 % (ref 12.0–46.0)
Lymphs Abs: 1.5 10*3/uL (ref 0.7–4.0)
MCHC: 34.1 g/dL (ref 30.0–36.0)
MCV: 92.3 fl (ref 78.0–100.0)
MONOS PCT: 7.4 % (ref 3.0–12.0)
Monocytes Absolute: 0.4 10*3/uL (ref 0.1–1.0)
NEUTROS ABS: 3.7 10*3/uL (ref 1.4–7.7)
NEUTROS PCT: 63 % (ref 43.0–77.0)
PLATELETS: 290 10*3/uL (ref 150.0–400.0)
RBC: 5.62 Mil/uL (ref 4.22–5.81)
RDW: 14.1 % (ref 11.5–15.5)
WBC: 5.8 10*3/uL (ref 4.0–10.5)

## 2018-03-29 LAB — BASIC METABOLIC PANEL
BUN: 15 mg/dL (ref 6–23)
CALCIUM: 9.4 mg/dL (ref 8.4–10.5)
CO2: 27 meq/L (ref 19–32)
Chloride: 101 mEq/L (ref 96–112)
Creatinine, Ser: 1.13 mg/dL (ref 0.40–1.50)
GFR: 72.2 mL/min (ref >60.00)
Glucose, Bld: 84 mg/dL (ref 70–99)
Potassium: 4.5 mEq/L (ref 3.5–5.1)
SODIUM: 138 meq/L (ref 135–145)

## 2018-03-29 LAB — LIPID PANEL
CHOLESTEROL: 144 mg/dL (ref 0–200)
HDL: 39 mg/dL — AB (ref >39.00)
LDL CALC: 86 mg/dL (ref 0–99)
NonHDL: 105.31
TRIGLYCERIDES: 98 mg/dL (ref 0.0–149.0)
Total CHOL/HDL Ratio: 4
VLDL: 19.6 mg/dL (ref 0.0–40.0)

## 2018-03-29 LAB — HEPATIC FUNCTION PANEL
ALT: 27 U/L (ref 0–53)
AST: 20 U/L (ref 0–37)
Albumin: 4.5 g/dL (ref 3.5–5.2)
Alkaline Phosphatase: 62 U/L (ref 39–117)
BILIRUBIN TOTAL: 1 mg/dL (ref 0.2–1.2)
Bilirubin, Direct: 0.2 mg/dL (ref 0.0–0.3)
Total Protein: 7.5 g/dL (ref 6.0–8.3)

## 2018-03-29 LAB — PSA: PSA: 0.42 ng/mL (ref 0.10–4.00)

## 2018-03-29 LAB — TSH: TSH: 4.19 u[IU]/mL (ref 0.35–4.50)

## 2018-03-29 MED ORDER — ALLOPURINOL 300 MG PO TABS: 300.0000 mg | ORAL_TABLET | Freq: Every day | ORAL | 3 refills | Status: DC

## 2018-03-29 NOTE — Patient Instructions (Signed)
It was great seeing you today   You are doing great with weight loss, keep it up!   I will follow up with you regarding your blood work   Please let me know if you need anything and we will see you in a year

## 2018-03-29 NOTE — Progress Notes (Signed)
Subjective:    Patient ID: Steven Peters, male    DOB: [birth year], 52 y.o.   MRN: [id number]  HPI  Patient presents for yearly preventative medicine examination. He is a pleasant 52 year old male who  has a past medical history of Allergy and Gout.  Gout - Takes Allopurinol daily. No gout flares over the last year   All immunizations and health maintenance protocols were reviewed with the patient and needed orders were placed. Due for seasonal flu vaccination  Appropriate screening laboratory values were ordered for the patient including screening of hyperlipidemia, renal function and hepatic function. If indicated by BPH, a PSA was ordered.  Medication reconciliation,  past medical history, social history, problem list and allergies were reviewed in detail with the patient  Goals were established with regard to weight loss, exercise, and  diet in compliance with medications. He continues to work on weight loss through diet. He does not exercise outside of playing golf.  Wt Readings from Last 3 Encounters:  03/29/18 276 lb 12.8 oz (125.6 kg)  05/24/17 280 lb (127 kg)  03/28/17 283 lb (128.4 kg)   End of life planning was discussed.  He is up to date on routine colonoscopy, he is due in 2022 ( 5 year plan). He as had his annual vision and dental screens.   He is going to his orthopedic today to have fluid drained from his left knee   Review of Systems  Constitutional: Negative.   HENT: Negative.   Eyes: Negative.   Respiratory: Negative.   Cardiovascular: Negative.   Gastrointestinal: Negative.   Endocrine: Negative.   Genitourinary: Negative.   Musculoskeletal: Positive for joint swelling.  Skin: Negative.   Allergic/Immunologic: Negative.   Neurological: Negative.   Hematological: Negative.   Psychiatric/Behavioral: Negative.   All other systems reviewed and are negative.  Past Medical History:  Diagnosis Date  . Allergy   . Gout     Social History    Socioeconomic History  . Marital status: Married    Spouse name: Not on file  . Number of children: Not on file  . Years of education: Not on file  . Highest education level: Not on file  Occupational History  . Not on file  Social Needs  . Financial resource strain: Not on file  . Food insecurity:    Worry: Not on file    Inability: Not on file  . Transportation needs:    Medical: Not on file    Non-medical: Not on file  Tobacco Use  . Smoking status: Former Research scientist (life sciences)  . Smokeless tobacco: Never Used  Substance and Sexual Activity  . Alcohol use: Yes    Alcohol/week: 2.0 standard drinks    Types: 2 Shots of liquor per week    Comment: weekends only  . Drug use: No  . Sexual activity: Not on file  Lifestyle  . Physical activity:    Days per week: Not on file    Minutes per session: Not on file  . Stress: Not on file  Relationships  . Social connections:    Talks on phone: Not on file    Gets together: Not on file    Attends religious service: Not on file    Active member of club or organization: Not on file    Attends meetings of clubs or organizations: Not on file    Relationship status: Not on file  . Intimate partner violence:    Fear of current or  ex partner: Not on file    Emotionally abused: Not on file    Physically abused: Not on file    Forced sexual activity: Not on file  Other Topics Concern  . Not on file  Social History Narrative  . Not on file    Past Surgical History:  Procedure Laterality Date  . KNEE ARTHROSCOPY     right knee  . WISDOM TOOTH EXTRACTION      Family History  Adopted: Yes  Problem Relation Age of Onset  . Colon cancer Neg Hx     No Known Allergies  Current Outpatient Medications on File Prior to Visit  Medication Sig Dispense Refill  . allopurinol (ZYLOPRIM) 300 MG tablet Take 1 tablet (300 mg total) by mouth daily. 90 tablet 3   No current facility-administered medications on file prior to visit.     BP 132/80  (BP Location: Right Arm, Patient Position: Sitting, Cuff Size: Large)   Pulse 88   Temp 97.7 F (36.5 C) (Oral)   Ht 5' 11.25" (1.81 m)   Wt 276 lb 12.8 oz (125.6 kg)   SpO2 95%   BMI 38.34 kg/m       Objective:   Physical Exam  Constitutional: He is oriented to person, place, and time. He appears well-developed and well-nourished. No distress.  Obese    HENT:  Head: Normocephalic and atraumatic.  Right Ear: External ear normal.  Left Ear: External ear normal.  Nose: Nose normal.  Mouth/Throat: Oropharynx is clear and moist. No oropharyngeal exudate.  Eyes: Pupils are equal, round, and reactive to light. Conjunctivae and EOM are normal. Right eye exhibits no discharge. Left eye exhibits no discharge. No scleral icterus.  Neck: Normal range of motion. Neck supple. No JVD present. No tracheal deviation present. No thyromegaly present.  Cardiovascular: Normal rate, regular rhythm, normal heart sounds and intact distal pulses. Exam reveals no gallop and no friction rub.  No murmur heard. Pulmonary/Chest: Effort normal and breath sounds normal. No stridor. No respiratory distress. He has no wheezes. He has no rales. He exhibits no tenderness.  Abdominal: Soft. Bowel sounds are normal. He exhibits no distension and no mass. There is no tenderness. There is no rebound and no guarding. No hernia.  Musculoskeletal: Normal range of motion. He exhibits no edema, tenderness or deformity.       Left knee: He exhibits swelling. He exhibits normal range of motion, no deformity, no erythema and no bony tenderness.  Lymphadenopathy:    He has no cervical adenopathy.  Neurological: He is alert and oriented to person, place, and time. He displays normal reflexes. No cranial nerve deficit or sensory deficit. He exhibits normal muscle tone. Coordination normal.  Skin: Skin is warm and dry. Capillary refill takes less than 2 seconds. No rash noted. He is not diaphoretic. No erythema. No pallor.   Psychiatric: He has a normal mood and affect. His behavior is normal. Judgment and thought content normal.  Nursing note and vitals reviewed.     Assessment & Plan:  1. Routine general medical examination at a health care facility - Encouraged continued weight loss through diet  - Follow up in one year or sooner if needed - Basic metabolic panel - CBC with Differential/Platelet - Hepatic function panel - Lipid panel - TSH - PSA  2. History of gout - allopurinol (ZYLOPRIM) 300 MG tablet; Take 1 tablet (300 mg total) by mouth daily.  Dispense: 90 tablet; Refill: 3  - Basic metabolic  panel - CBC with Differential/Platelet - Hepatic function panel - Lipid panel - TSH - PSA  3. Encounter for screening for HIV  - HIV Antibody (routine testing w rflx)  4. Need for influenza vaccination  - Flu Vaccine QUAD 6+ mos PF IM (Fluarix Quad PF)  [name], NP

## 2018-03-30 LAB — HIV ANTIBODY (ROUTINE TESTING W REFLEX): HIV: NONREACTIVE

## 2018-04-25 ENCOUNTER — Ambulatory Visit (INDEPENDENT_AMBULATORY_CARE_PROVIDER_SITE_OTHER): Payer: Self-pay

## 2018-04-25 ENCOUNTER — Ambulatory Visit (INDEPENDENT_AMBULATORY_CARE_PROVIDER_SITE_OTHER): Payer: BLUE CROSS/BLUE SHIELD | Admitting: Orthopaedic Surgery

## 2018-04-25 ENCOUNTER — Encounter (INDEPENDENT_AMBULATORY_CARE_PROVIDER_SITE_OTHER): Payer: Self-pay | Admitting: Orthopaedic Surgery

## 2018-04-25 DIAGNOSIS — M1712 Unilateral primary osteoarthritis, left knee: Secondary | ICD-10-CM

## 2018-04-25 DIAGNOSIS — M25561 Pain in right knee: Secondary | ICD-10-CM | POA: Diagnosis not present

## 2018-04-25 DIAGNOSIS — G8929 Other chronic pain: Secondary | ICD-10-CM

## 2018-04-25 DIAGNOSIS — M25562 Pain in left knee: Secondary | ICD-10-CM

## 2018-04-25 HISTORY — DX: Unilateral primary osteoarthritis, left knee: M17.12

## 2018-04-25 MED ORDER — METHYLPREDNISOLONE ACETATE 40 MG/ML IJ SUSP
40.0000 mg | INTRAMUSCULAR | Status: AC | PRN
  Administered 2018-04-25: 40 mg via INTRA_ARTICULAR

## 2018-04-25 MED ORDER — LIDOCAINE HCL 1 % IJ SOLN
3.0000 mL | INTRAMUSCULAR | Status: AC | PRN
  Administered 2018-04-25: 3 mL

## 2018-04-25 NOTE — Progress Notes (Signed)
+9-   Office Visit Note   Patient: Steven Peters           Date of Birth: 28-Jul-1965           MRN: [id number] Visit Date: 04/25/2018              Requested by: Steven Peng, NP [address] [place], [place] [address] PCP: Steven Peng, NP   Assessment & Plan: Visit Diagnoses:  1. Chronic pain of left knee   2. Chronic pain of right knee   3. Unilateral primary osteoarthritis, left knee     Plan: Steven Peters understands that he does have significant arthritis in his left knee.  I was able to aspirate at least 40 cc of fluid off the knee.  Place a steroid in his left knee as well.  We will release try another round of hyaluronic acid to see if this will temporize his symptoms.  All questions concerns were answered and addressed.  He understands a long run he may end up needing a knee replacement.  We are going to try to treat him as conservatively as possible until then.  We will see him back in 4 weeks to place hyaluronic acid in his left knee.  Follow-Up Instructions: Return in about 4 weeks (around 05/23/2018).   Orders:  Orders Placed This Encounter  Procedures  . Large Joint Inj  . XR Knee 1-2 Views Left  . XR Knee 1-2 Views Right   No orders of the defined types were placed in this encounter.     Procedures: Large Joint Inj: R knee on 04/25/2018 1:41 PM Indications: diagnostic evaluation and pain Details: 22 G 1.5 in needle, superolateral approach  Arthrogram: No  Medications: 3 mL lidocaine 1 %; 40 mg methylPREDNISolone acetate 40 MG/ML Outcome: tolerated well, no immediate complications Procedure, treatment alternatives, risks and benefits explained, specific risks discussed. Consent was given by the patient. Immediately prior to procedure a time out was called to verify the correct patient, procedure, equipment, support staff and site/side marked as required. Patient was prepped and draped in the usual sterile fashion.       Clinical Data: No additional  findings.   Subjective: Chief Complaint  Patient presents with  . Left Knee - Pain  Steven Peters is well-known to me.  He is actually 1 of my childhood friends.  He is a 52 year old with significant problems with his left knee.  He actually had arthroscopic intervention in December of last year of that left knee.  Since then he has had multiple aspirations of the knee.  He has had hyaluronic acid in the past as well as steroid injections.  The knee still chronically swells and is having issues with mobility with his left knee.  With that being said is been putting more weight to his right knee is been having some pain in his right knee as well but no swelling.  HPI  Review of Systems He currently denies any headache, chest pain, short of breath, fever, chills, nausea, vomiting.  Objective: Vital Signs: There were no vitals taken for this visit.  Physical Exam He is alert and oriented x3 and in no acute distress Ortho Exam Examination of his left and right knees are performed in the office today.  His left knee does have a moderate effusion.  His right knee does not.  Both knees have patellofemoral crepitation.  Both knees have medial lateral joint line tenderness but more so on the left than the  right.  A lot of his tenderness on the left knee is lateral. Specialty Comments:  No specialty comments available.  Imaging: Xr Knee 1-2 Views Left  Result Date: 04/25/2018 2 views of the left knee show moderate tricompartmental arthritic changes.  There is a joint effusion.  Xr Knee 1-2 Views Right  Result Date: 04/25/2018 2 views of the right knee show moderate tricompartmental arthritic changes.    PMFS History: Patient Active Problem List   Diagnosis Date Noted  . Unilateral primary osteoarthritis, left knee 04/25/2018  . History of gout 08/26/2014  . Routine general medical examination at a health care facility 08/26/2014  . Left hip pain 06/15/2011   Past Medical History:    Diagnosis Date  . Allergy   . Gout     Family History  Adopted: Yes  Problem Relation Age of Onset  . Colon cancer Neg Hx     Past Surgical History:  Procedure Laterality Date  . KNEE ARTHROSCOPY     right knee  . WISDOM TOOTH EXTRACTION     Social History   Occupational History  . Not on file  Tobacco Use  . Smoking status: Former Research scientist (life sciences)  . Smokeless tobacco: Never Used  Substance and Sexual Activity  . Alcohol use: Yes    Alcohol/week: 2.0 standard drinks    Types: 2 Shots of liquor per week    Comment: weekends only  . Drug use: No  . Sexual activity: Not on file

## 2018-04-26 ENCOUNTER — Encounter (INDEPENDENT_AMBULATORY_CARE_PROVIDER_SITE_OTHER): Payer: Self-pay

## 2018-04-26 ENCOUNTER — Telehealth (INDEPENDENT_AMBULATORY_CARE_PROVIDER_SITE_OTHER): Payer: Self-pay

## 2018-04-26 NOTE — Telephone Encounter (Signed)
Left knee gel injection ?

## 2018-04-26 NOTE — Telephone Encounter (Signed)
Noted  

## 2018-05-07 ENCOUNTER — Telehealth (INDEPENDENT_AMBULATORY_CARE_PROVIDER_SITE_OTHER): Payer: Self-pay

## 2018-05-07 NOTE — Telephone Encounter (Signed)
Submitted VOB for SynviscOne, left knee. 

## 2018-05-16 ENCOUNTER — Telehealth (INDEPENDENT_AMBULATORY_CARE_PROVIDER_SITE_OTHER): Payer: Self-pay

## 2018-05-16 NOTE — Telephone Encounter (Signed)
FYI- Called and left a VM advising patient that gel injection are not covered by his insurance, which is [employer].

## 2018-05-16 NOTE — Telephone Encounter (Signed)
FYI--His insurance will NOT cover gel injections

## 2018-05-29 ENCOUNTER — Ambulatory Visit (INDEPENDENT_AMBULATORY_CARE_PROVIDER_SITE_OTHER): Payer: BLUE CROSS/BLUE SHIELD | Admitting: Orthopaedic Surgery

## 2018-05-29 ENCOUNTER — Encounter (INDEPENDENT_AMBULATORY_CARE_PROVIDER_SITE_OTHER): Payer: Self-pay | Admitting: Orthopaedic Surgery

## 2018-05-29 DIAGNOSIS — M1712 Unilateral primary osteoarthritis, left knee: Secondary | ICD-10-CM

## 2018-05-29 NOTE — Progress Notes (Signed)
HPI: Mr. Viernes comes in today for follow-up of his left knee.  He states left knee overall is doing well status post injection on 04/25/2018.  Is questioning other options due to the fact that he has been denied for supplemental injections for the knee.  He is wearing a pullover knee sleeve and finds this somewhat beneficial.   Physical exam: Left knee good range of motion no effusion.  Impression: Left knee osteoarthritis.  Plan: No charge for today's office visit.  Discussed with him that we can continue with cortisone injections he could pay out-of-pocket for the supplemental injections.  We will see him back on an as-needed basis.

## 2018-06-20 ENCOUNTER — Ambulatory Visit (INDEPENDENT_AMBULATORY_CARE_PROVIDER_SITE_OTHER): Payer: BLUE CROSS/BLUE SHIELD | Admitting: Orthopaedic Surgery

## 2018-06-26 DIAGNOSIS — M25662 Stiffness of left knee, not elsewhere classified: Secondary | ICD-10-CM | POA: Diagnosis not present

## 2018-07-09 ENCOUNTER — Ambulatory Visit (INDEPENDENT_AMBULATORY_CARE_PROVIDER_SITE_OTHER): Payer: BLUE CROSS/BLUE SHIELD | Admitting: Orthopaedic Surgery

## 2018-09-25 IMAGING — CR DG KNEE COMPLETE 4+V*L*
4 series · 4 of 4 positions shown · non-contrast
Comparison: Left knee MRI October 04, 2016

CLINICAL DATA: Pain after prolonged standing

EXAM:
LEFT KNEE - COMPLETE 4+ VIEW

[t knee ap left]
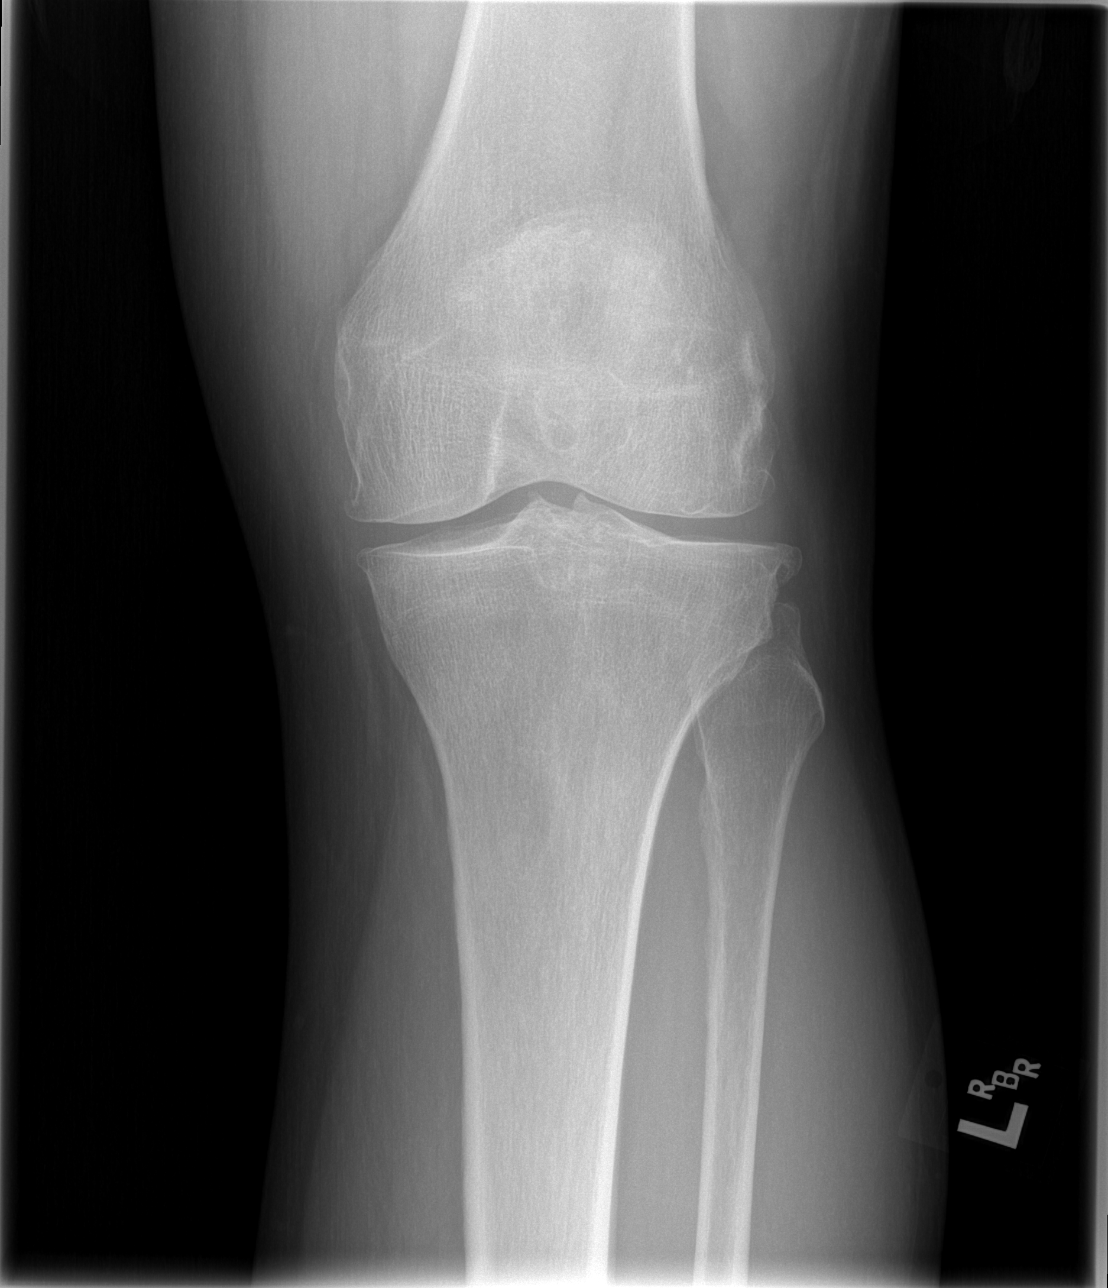

[t knee oblique left (1 of 2)]
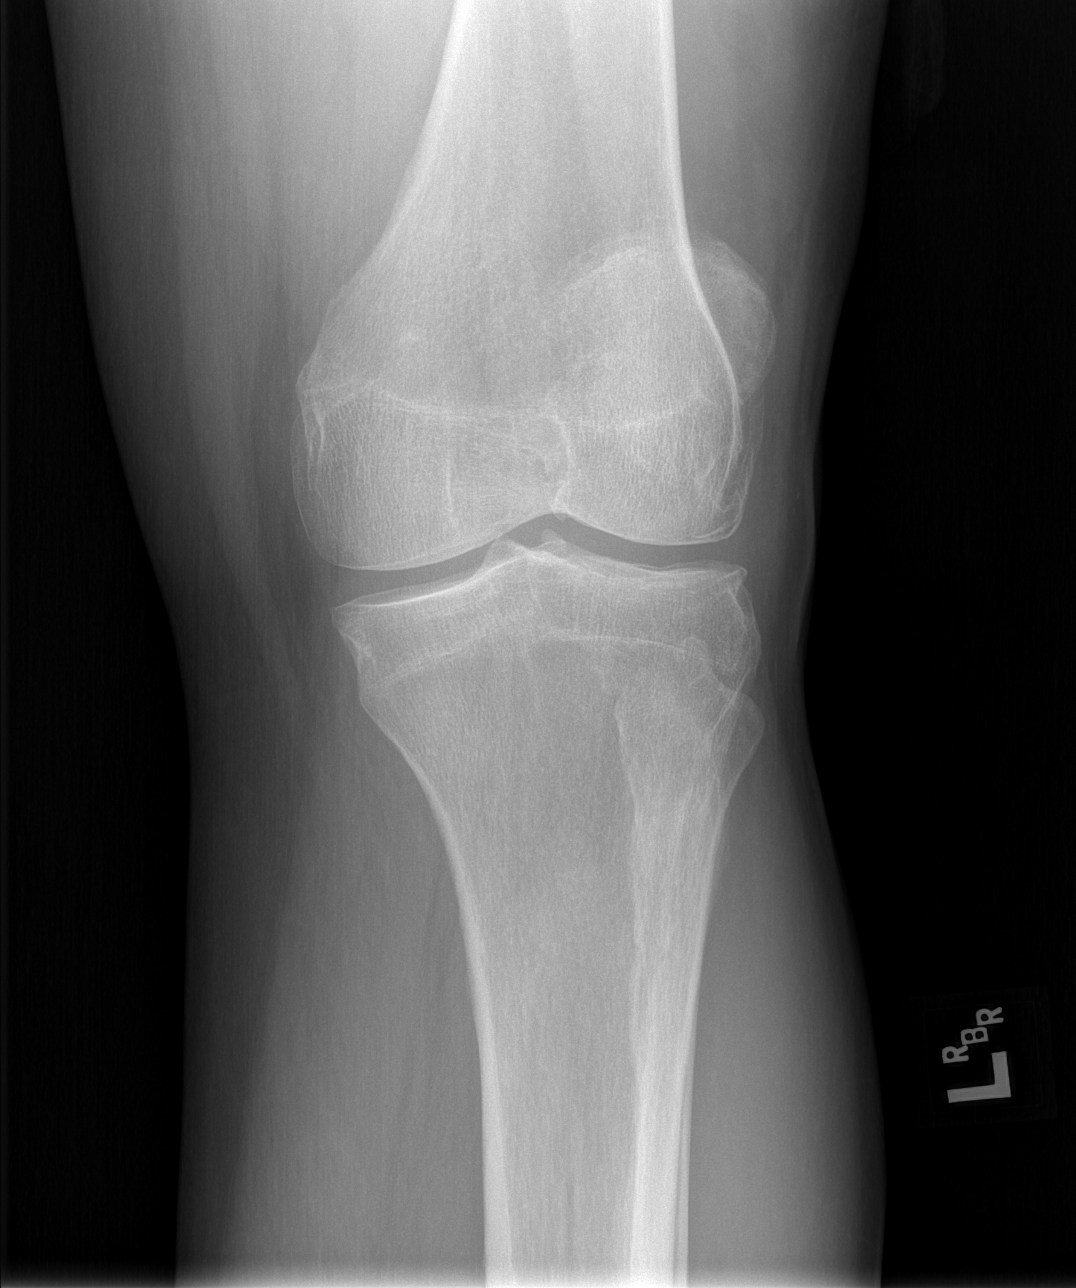

[t knee oblique left (2 of 2)]
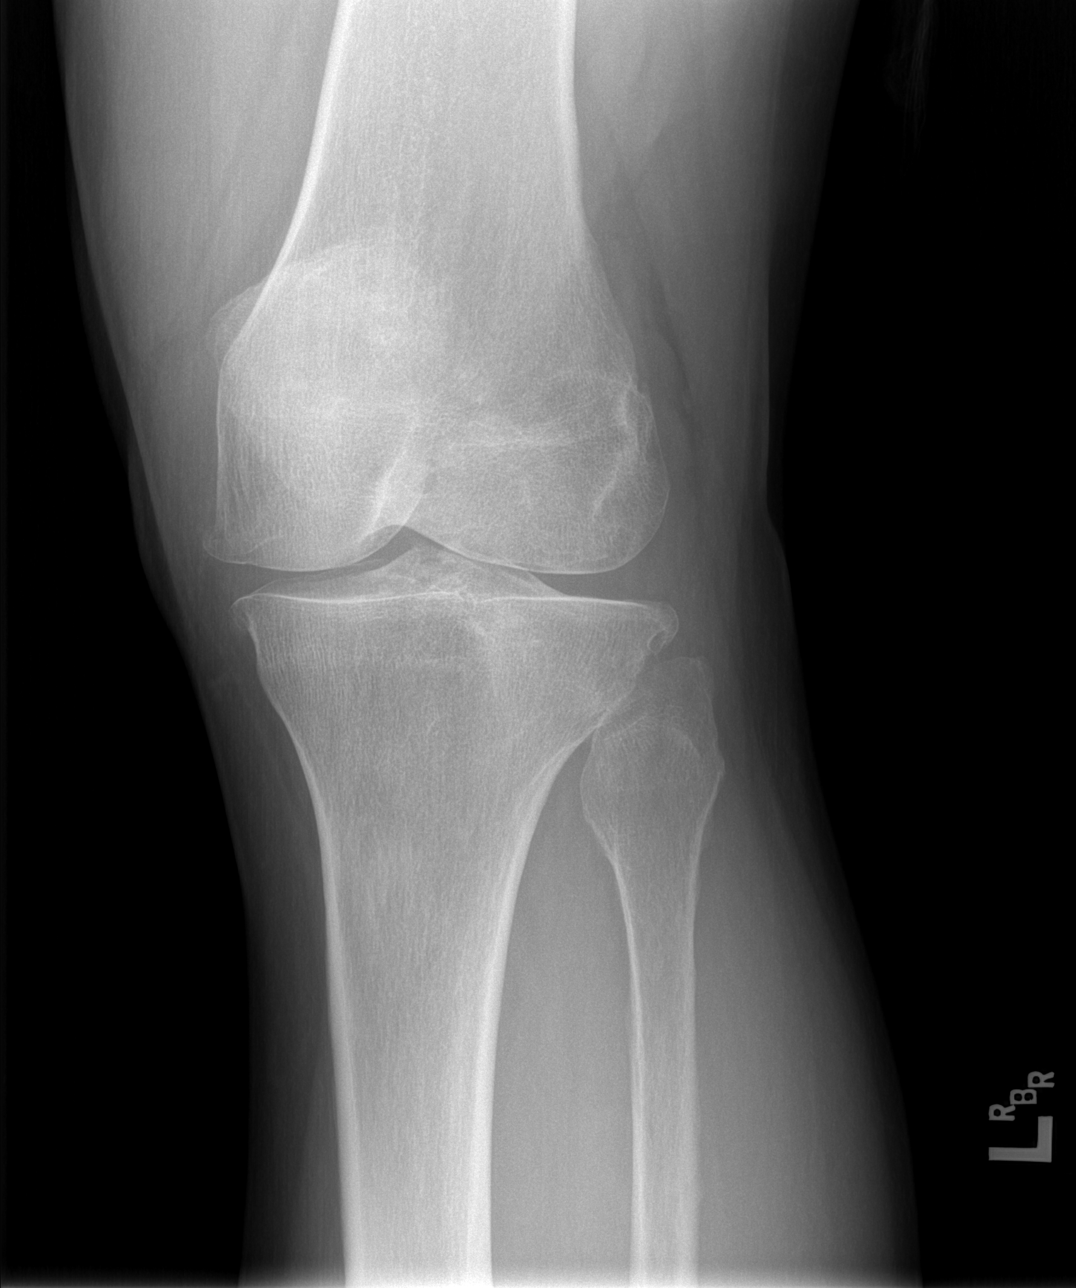

[t knee lat left]
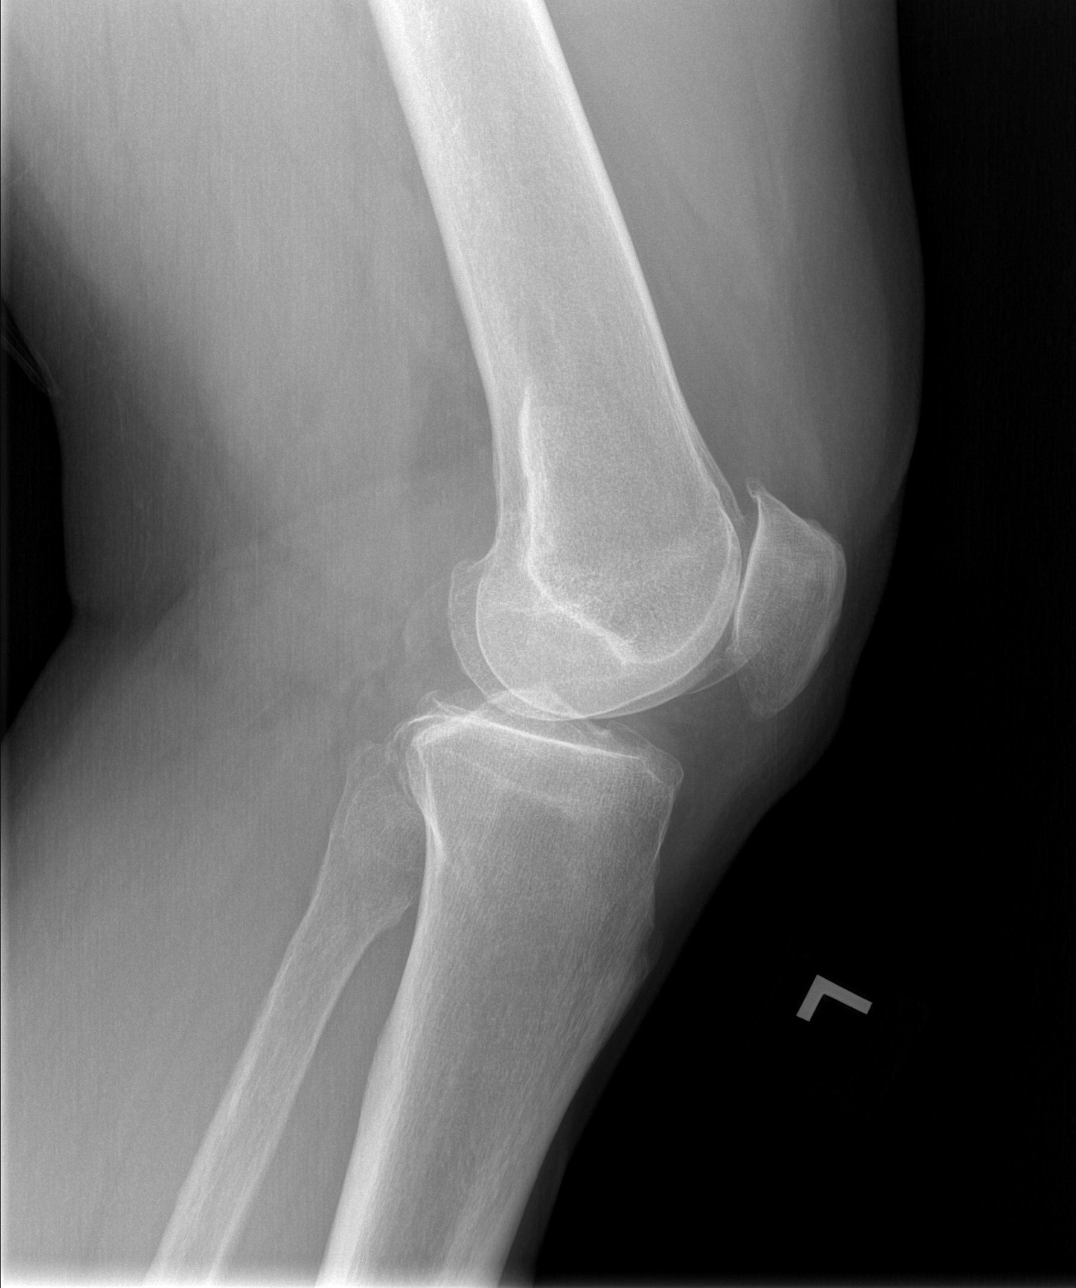

[4 of 4 positions shown; findings below may reference images not displayed]

FINDINGS: Frontal, lateral, and bilateral oblique views were obtained. There
is no evident fracture or dislocation. There is no appreciable knee
joint effusion. There is moderate narrowing of the patellofemoral
joint with spurring in all compartments. There is no appreciable
narrowing medially or laterally. No erosive change.
IMPRESSION: Spurring in all compartments with moderate narrowing of the
patellofemoral joint. No fracture or joint effusion evident.

## 2018-12-05 DIAGNOSIS — M1712 Unilateral primary osteoarthritis, left knee: Secondary | ICD-10-CM | POA: Diagnosis not present

## 2018-12-05 DIAGNOSIS — M25662 Stiffness of left knee, not elsewhere classified: Secondary | ICD-10-CM | POA: Diagnosis not present

## 2018-12-17 ENCOUNTER — Other Ambulatory Visit: Payer: BC Managed Care – PPO

## 2018-12-17 ENCOUNTER — Encounter: Payer: Self-pay | Admitting: Family Medicine

## 2018-12-17 ENCOUNTER — Ambulatory Visit (INDEPENDENT_AMBULATORY_CARE_PROVIDER_SITE_OTHER): Payer: BC Managed Care – PPO | Admitting: Family Medicine

## 2018-12-17 ENCOUNTER — Ambulatory Visit: Payer: Self-pay | Admitting: Adult Health

## 2018-12-17 ENCOUNTER — Telehealth: Payer: Self-pay | Admitting: *Deleted

## 2018-12-17 ENCOUNTER — Other Ambulatory Visit: Payer: Self-pay

## 2018-12-17 DIAGNOSIS — Z20822 Contact with and (suspected) exposure to covid-19: Secondary | ICD-10-CM

## 2018-12-17 DIAGNOSIS — R6889 Other general symptoms and signs: Secondary | ICD-10-CM | POA: Diagnosis not present

## 2018-12-17 DIAGNOSIS — Z209 Contact with and (suspected) exposure to unspecified communicable disease: Secondary | ICD-10-CM

## 2018-12-17 DIAGNOSIS — Z20828 Contact with and (suspected) exposure to other viral communicable diseases: Secondary | ICD-10-CM

## 2018-12-17 NOTE — Telephone Encounter (Signed)
  Pt. Reports he was on vacation in [place] last week and was notified a worker in [employer] he was in, tested positive for COVID 19. His employer is requesting he be tested. Warm transfer to Tammy in the practice for a virtual visit. Answer Assessment - Initial Assessment Questions 1. CLOSE CONTACT: "Who is the person with the confirmed or suspected COVID-19 infection that you were exposed to?"     Vacation 2. PLACE of CONTACT: "Where were you when you were exposed to COVID-19?" (e.g., home, school, medical waiting room; which city?)     [facility] 3. TYPE of CONTACT: "How much contact was there?" (e.g., sitting next to, live in same house, work in same office, same building)     Unsure 4. DURATION of CONTACT: "How long were you in contact with the COVID-19 patient?" (e.g., a few seconds, passed by person, a few minutes, live with the patient)     Unsure 5. DATE of CONTACT: "When did you have contact with a COVID-19 patient?" (e.g., how many days ago)     Last week 6. TRAVEL: "Have you traveled out of the country recently?" If so, "When and where?"     * Also ask about out-of-state travel, since the CDC has identified some high-risk cities for community spread in the [place].     * Note: Travel becomes less relevant if there is widespread community transmission where the patient lives.     No 7. COMMUNITY SPREAD: "Are there lots of cases of COVID-19 (community spread) where you live?" (See public health department website, if unsure)       Yes 8. SYMPTOMS: "Do you have any symptoms?" (e.g., fever, cough, breathing difficulty)     No 9. PREGNANCY OR POSTPARTUM: "Is there any chance you are pregnant?" "When was your last menstrual period?" "Did you deliver in the last 2 weeks?"     n/a 10. HIGH RISK: "Do you have any heart or lung problems? Do you have a weak immune system?" (e.g., CHF, COPD, asthma, HIV positive, chemotherapy, renal failure, diabetes mellitus, sickle cell anemia)        No  Protocols used: CORONAVIRUS (COVID-19) EXPOSURE-A-AH

## 2018-12-17 NOTE — Telephone Encounter (Signed)
Steven Peters, CMA  [facility] [employer]        Dr. Sarajane Jews would like to have this patient tested for Covid 19. He was exposed to a Doctor, general practice at [employer] at [facility] over the weekend. Thanks     Pt scheduled for today at [facility] site. Testing process reviewed; pt verbalizes understanding.

## 2018-12-17 NOTE — Telephone Encounter (Signed)
Pt scheduled for virtual visit with provider °

## 2018-12-17 NOTE — Progress Notes (Signed)
   Subjective:    Patient ID: Steven Peters, male    DOB: 15-Feb-1966, 53 y.o.   MRN: [id number]  HPI Virtual Visit via Telephone Note  I connected with the patient on 12/17/18 at  9:45 AM EDT by telephone and verified that I am speaking with the correct person using two identifiers. We attempted to connect virtually but we had technical difficulties with the audio and video.     I discussed the limitations, risks, security and privacy concerns of performing an evaluation and management service by telephone and the availability of in person appointments. I also discussed with the patient that there may be a patient responsible charge related to this service. The patient expressed understanding and agreed to proceed.  Location patient: home Location provider: work or home office Participants present for the call: patient, provider Patient did not have a visit in the prior 7 days to address this/these issue(s).   History of Present Illness: Here asking for a Covid-19 test. He feels fine. He and his wife just got home from a trip to [facility]. 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 Morris told his employer, and they will not allow him to come to work unless he is tested.    Observations/Objective: Patient sounds cheerful and well on the phone. I do not appreciate any SOB. Speech and thought processing are grossly intact. Patient reported vitals:  Assessment and Plan: Exposure to Covid-19. We will arrange for him to be tested.  Alysia Penna, MD   Follow Up Instructions:     [phone] 11-20 [phone] 21-30 I did not refer this patient for an OV in the next 24 hours for this/these issue(s).  I discussed the assessment and treatment plan with the patient. The patient was provided an opportunity to ask questions and all were answered. The patient agreed with the  plan and demonstrated an understanding of the instructions.   The patient was advised to call back or seek an in-person evaluation if the symptoms worsen or if the condition fails to improve as anticipated.  I provided 10 minutes of non-face-to-face time during this encounter.   Alysia Penna, MD    Review of Systems     Objective:   Physical Exam        Assessment & Plan:

## 2018-12-19 LAB — NOVEL CORONAVIRUS, NAA: SARS-CoV-2, NAA: NOT DETECTED

## 2019-01-02 DIAGNOSIS — M1712 Unilateral primary osteoarthritis, left knee: Secondary | ICD-10-CM | POA: Diagnosis not present

## 2019-02-24 DIAGNOSIS — M25512 Pain in left shoulder: Secondary | ICD-10-CM | POA: Diagnosis not present

## 2019-03-12 DIAGNOSIS — M19012 Primary osteoarthritis, left shoulder: Secondary | ICD-10-CM | POA: Diagnosis not present

## 2019-04-01 ENCOUNTER — Telehealth: Payer: Self-pay | Admitting: Adult Health

## 2019-04-01 NOTE — Telephone Encounter (Signed)
Spoke with pt this is Steven Peters pt he said he did not have hip surgery or any swelling or shortness of breathe

## 2019-04-01 NOTE — Telephone Encounter (Signed)
This was an error. This message belonged for another patient that the correct message was sent to [facility].

## 2019-04-01 NOTE — Telephone Encounter (Signed)
The patient had emergency hip surgery a month ago and was in [place] for rehab. The patient was told that she needs to follow up with her provider. The patient also has feet swelling and SOB.  Please Advise

## 2019-04-02 NOTE — Telephone Encounter (Signed)
Appointment was made for the patients physical in December.

## 2019-04-12 ENCOUNTER — Other Ambulatory Visit: Payer: Self-pay | Admitting: Adult Health

## 2019-04-12 DIAGNOSIS — M1A9XX0 Chronic gout, unspecified, without tophus (tophi): Secondary | ICD-10-CM

## 2019-04-12 NOTE — Telephone Encounter (Signed)
Sent to the pharmacy by e-scribe for 90 days.  Has upcoming cpx. 

## 2019-06-12 ENCOUNTER — Other Ambulatory Visit: Payer: Self-pay

## 2019-06-13 ENCOUNTER — Ambulatory Visit (INDEPENDENT_AMBULATORY_CARE_PROVIDER_SITE_OTHER): Payer: BC Managed Care – PPO | Admitting: Adult Health

## 2019-06-13 ENCOUNTER — Encounter: Payer: Self-pay | Admitting: Adult Health

## 2019-06-13 VITALS — BP 122/84 | Temp 98.4°F | Wt 290.0 lb

## 2019-06-13 DIAGNOSIS — Z0000 Encounter for general adult medical examination without abnormal findings: Secondary | ICD-10-CM | POA: Diagnosis not present

## 2019-06-13 DIAGNOSIS — Z8739 Personal history of other diseases of the musculoskeletal system and connective tissue: Secondary | ICD-10-CM

## 2019-06-13 DIAGNOSIS — Z125 Encounter for screening for malignant neoplasm of prostate: Secondary | ICD-10-CM | POA: Diagnosis not present

## 2019-06-13 DIAGNOSIS — E668 Other obesity: Secondary | ICD-10-CM

## 2019-06-13 LAB — LIPID PANEL
Cholesterol: 177 mg/dL (ref 0–200)
HDL: 45.6 mg/dL (ref >39.00)
LDL Cholesterol: 118 mg/dL — ABNORMAL HIGH (ref 0–99)
NonHDL: 131.33
Total CHOL/HDL Ratio: 4
Triglycerides: 68 mg/dL (ref 0.0–149.0)
VLDL: 13.6 mg/dL (ref 0.0–40.0)

## 2019-06-13 LAB — CBC WITH DIFFERENTIAL/PLATELET
Basophils Absolute: 0 10*3/uL (ref 0.0–0.1)
Basophils Relative: 0.7 % (ref 0.0–3.0)
Eosinophils Absolute: 0.2 10*3/uL (ref 0.0–0.7)
Eosinophils Relative: 3.5 % (ref 0.0–5.0)
HCT: 51.8 % (ref 39.0–52.0)
Hemoglobin: 17.4 g/dL — ABNORMAL HIGH (ref 13.0–17.0)
Lymphocytes Relative: 28.7 % (ref 12.0–46.0)
Lymphs Abs: 1.8 10*3/uL (ref 0.7–4.0)
MCHC: 33.5 g/dL (ref 30.0–36.0)
MCV: 92.3 fl (ref 78.0–100.0)
Monocytes Absolute: 0.5 10*3/uL (ref 0.1–1.0)
Monocytes Relative: 8.3 % (ref 3.0–12.0)
Neutro Abs: 3.6 10*3/uL (ref 1.4–7.7)
Neutrophils Relative %: 58.8 % (ref 43.0–77.0)
Platelets: 298 10*3/uL (ref 150.0–400.0)
RBC: 5.61 Mil/uL (ref 4.22–5.81)
RDW: 13.9 % (ref 11.5–15.5)
WBC: 6.2 10*3/uL (ref 4.0–10.5)

## 2019-06-13 LAB — TSH: TSH: 2.23 u[IU]/mL (ref 0.35–4.50)

## 2019-06-13 LAB — COMPREHENSIVE METABOLIC PANEL
ALT: 28 U/L (ref 0–53)
AST: 19 U/L (ref 0–37)
Albumin: 4.4 g/dL (ref 3.5–5.2)
Alkaline Phosphatase: 49 U/L (ref 39–117)
BUN: 15 mg/dL (ref 6–23)
CO2: 26 mEq/L (ref 19–32)
Calcium: 9.4 mg/dL (ref 8.4–10.5)
Chloride: 104 mEq/L (ref 96–112)
Creatinine, Ser: 1.15 mg/dL (ref 0.40–1.50)
GFR: 66.26 mL/min (ref >60.00)
Glucose, Bld: 90 mg/dL (ref 70–99)
Potassium: 4.5 mEq/L (ref 3.5–5.1)
Sodium: 139 mEq/L (ref 135–145)
Total Bilirubin: 0.8 mg/dL (ref 0.2–1.2)
Total Protein: 7.2 g/dL (ref 6.0–8.3)

## 2019-06-13 LAB — PSA: PSA: 0.4 ng/mL (ref 0.10–4.00)

## 2019-06-13 LAB — HEMOGLOBIN A1C: Hgb A1c MFr Bld: 5.2 % (ref 4.6–6.5)

## 2019-06-13 MED ORDER — ALLOPURINOL 300 MG PO TABS: ORAL_TABLET | ORAL | 3 refills | Status: DC

## 2019-06-13 NOTE — Progress Notes (Signed)
Subjective:    Patient ID: Steven Peters, male    DOB: 06/26/1966, 53 y.o.   MRN: [id number]  HPI  Patient presents for yearly preventative medicine examination. He is a pleasant 53 year old who  has a past medical history of Allergy and Gout.   H/o gout - takes allopurinol 300 mg daily. He denies any recentl flares  Obesity - His weight is up 24 pounds over the last year.  He reports that his diet has been horrible, consisting of a lot of carbs and sweets and he is not exercising.  He has also been drinking more alcohol over the last few months.  Wt Readings from Last 3 Encounters:  06/13/19 290 lb (131.5 kg)  03/29/18 276 lb 12.8 oz (125.6 kg)  05/24/17 280 lb (127 kg)    All immunizations and health maintenance protocols were reviewed with the patient and needed orders were placed.  Appropriate screening laboratory values were ordered for the patient including screening of hyperlipidemia, renal function and hepatic function. If indicated by BPH, a PSA was ordered.  Medication reconciliation,  past medical history, social history, problem list and allergies were reviewed in detail with the patient  Goals were established with regard to weight loss, exercise, and  diet in compliance with medications  End of life planning was discussed.  He is due for colonoscopy in 2022.  Has not been seen by his dentist this year but did have an eye exam.  Has no acute complaints   Review of Systems  Constitutional: Negative.   HENT: Negative.   Eyes: Negative.   Respiratory: Negative.   Cardiovascular: Negative.   Gastrointestinal: Negative.   Endocrine: Negative.   Genitourinary: Negative.   Musculoskeletal: Positive for arthralgias and back pain.  Allergic/Immunologic: Negative.   Neurological: Negative.   Hematological: Negative.   Psychiatric/Behavioral: Negative.   All other systems reviewed and are negative.     Past Medical History:  Diagnosis Date  . Allergy   .  Gout     Social History   Socioeconomic History  . Marital status: Married    Spouse name: Not on file  . Number of children: Not on file  . Years of education: Not on file  . Highest education level: Not on file  Occupational History  . Not on file  Tobacco Use  . Smoking status: Former Research scientist (life sciences)  . Smokeless tobacco: Never Used  Substance and Sexual Activity  . Alcohol use: Yes    Alcohol/week: 2.0 standard drinks    Types: 2 Shots of liquor per week    Comment: weekends only  . Drug use: No  . Sexual activity: Not on file  Other Topics Concern  . Not on file  Social History Narrative  . Not on file   Social Determinants of Health   Financial Resource Strain:   . Difficulty of Paying Living Expenses: Not on file  Food Insecurity:   . Worried About Charity fundraiser in the Last Year: Not on file  . Ran Out of Food in the Last Year: Not on file  Transportation Needs:   . Lack of Transportation (Medical): Not on file  . Lack of Transportation (Non-Medical): Not on file  Physical Activity:   . Days of Exercise per Week: Not on file  . Minutes of Exercise per Session: Not on file  Stress:   . Feeling of Stress : Not on file  Social Connections:   . Frequency of Communication with  Friends and Family: Not on file  . Frequency of Social Gatherings with Friends and Family: Not on file  . Attends Religious Services: Not on file  . Active Member of Clubs or Organizations: Not on file  . Attends Archivist Meetings: Not on file  . Marital Status: Not on file  Intimate Partner Violence:   . Fear of Current or Ex-Partner: Not on file  . Emotionally Abused: Not on file  . Physically Abused: Not on file  . Sexually Abused: Not on file    Past Surgical History:  Procedure Laterality Date  . KNEE ARTHROSCOPY     right knee  . WISDOM TOOTH EXTRACTION      Family History  Adopted: Yes  Problem Relation Age of Onset  . Colon cancer Neg Hx     No Known  Allergies  Current Outpatient Medications on File Prior to Visit  Medication Sig Dispense Refill  . allopurinol (ZYLOPRIM) 300 MG tablet TAKE 1 TABLET(300 MG) BY MOUTH DAILY 90 tablet 0  . ibuprofen (ADVIL) 800 MG tablet TK 1 T PO  TID WITH FOOD PRF PAIN AND SWELLING     No current facility-administered medications on file prior to visit.    BP 122/84   Temp 98.4 F (36.9 C)   Wt 290 lb (131.5 kg)   BMI 40.16 kg/m       Objective:   Physical Exam Vitals and nursing note reviewed.  Constitutional:      General: He is not in acute distress.    Appearance: He is well-developed. He is obese. He is not diaphoretic.  HENT:     Head: Normocephalic and atraumatic.     Right Ear: Tympanic membrane, ear canal and external ear normal. There is no impacted cerumen.     Left Ear: Tympanic membrane, ear canal and external ear normal. There is no impacted cerumen.     Nose: Nose normal. No congestion or rhinorrhea.     Mouth/Throat:     Mouth: Mucous membranes are moist.     Pharynx: Oropharynx is clear. No oropharyngeal exudate or posterior oropharyngeal erythema.  Eyes:     General: No scleral icterus.       Right eye: No discharge.        Left eye: No discharge.     Conjunctiva/sclera: Conjunctivae normal.     Pupils: Pupils are equal, round, and reactive to light.  Neck:     Thyroid: No thyromegaly.     Trachea: No tracheal deviation.  Cardiovascular:     Rate and Rhythm: Normal rate and regular rhythm.     Pulses: Normal pulses.     Heart sounds: Normal heart sounds. No murmur. No friction rub. No gallop.   Pulmonary:     Effort: Pulmonary effort is normal. No respiratory distress.     Breath sounds: Normal breath sounds. No stridor. No wheezing, rhonchi or rales.  Chest:     Chest wall: No tenderness.  Abdominal:     General: Abdomen is flat. Bowel sounds are normal. There is no distension.     Palpations: Abdomen is soft. There is no mass.     Tenderness: There is no  abdominal tenderness. There is no right CVA tenderness, left CVA tenderness, guarding or rebound.     Hernia: No hernia is present.  Musculoskeletal:        General: No swelling, tenderness, deformity or signs of injury. Normal range of motion.     Cervical  back: Normal range of motion and neck supple.     Right lower leg: No edema.     Left lower leg: No edema.  Lymphadenopathy:     Cervical: No cervical adenopathy.  Skin:    General: Skin is warm and dry.     Capillary Refill: Capillary refill takes less than 2 seconds.     Coloration: Skin is not jaundiced or pale.     Findings: No bruising, erythema, lesion or rash.  Neurological:     General: No focal deficit present.     Mental Status: He is alert and oriented to person, place, and time. Mental status is at baseline.     Cranial Nerves: No cranial nerve deficit.     Sensory: No sensory deficit.     Motor: No weakness.     Coordination: Coordination normal.     Gait: Gait normal.     Deep Tendon Reflexes: Reflexes normal.  Psychiatric:        Mood and Affect: Mood normal.        Behavior: Behavior normal.        Thought Content: Thought content normal.        Judgment: Judgment normal.        Assessment & Plan:  1. Routine general medical examination at a health care facility -Encouraged extensive lifestyle modifications to help him lose weight. -Follow-up in 1 year or sooner if needed - CBC with Differential/Platelet - CMP - Hemoglobin A1c - Lipid panel - TSH  2. History of gout  - allopurinol (ZYLOPRIM) 300 MG tablet; TAKE 1 TABLET(300 MG) BY MOUTH DAILY  Dispense: 90 tablet; Refill: 3  3. Other obesity -He has had significant weight gain over the last year, this is concerning and puts him at higher risk of heart disease and diabetes.  He was encouraged to work on lifestyle modifications.  Does not want help at this time to lose weight. - CBC with Differential/Platelet - CMP - Hemoglobin A1c - Lipid panel -  TSH  4. Prostate cancer screening  - PSA   [name], NP

## 2019-06-17 ENCOUNTER — Encounter: Payer: Self-pay | Admitting: Adult Health

## 2019-06-26 ENCOUNTER — Ambulatory Visit (INDEPENDENT_AMBULATORY_CARE_PROVIDER_SITE_OTHER): Payer: BC Managed Care – PPO | Admitting: Orthopaedic Surgery

## 2019-06-26 ENCOUNTER — Other Ambulatory Visit: Payer: Self-pay

## 2019-06-26 DIAGNOSIS — M1712 Unilateral primary osteoarthritis, left knee: Secondary | ICD-10-CM

## 2019-06-26 MED ORDER — METHYLPREDNISOLONE ACETATE 40 MG/ML IJ SUSP
40.0000 mg | INTRAMUSCULAR | Status: AC | PRN
  Administered 2019-06-26: 40 mg via INTRA_ARTICULAR

## 2019-06-26 MED ORDER — LIDOCAINE HCL 1 % IJ SOLN
3.0000 mL | INTRAMUSCULAR | Status: AC | PRN
  Administered 2019-06-26: 09:00:00 3 mL

## 2019-06-26 NOTE — Progress Notes (Signed)
Office Visit Note   Patient: Steven Peters           Date of Birth: Nov 19, 1965           MRN: [id number] Visit Date: 06/26/2019              Requested by: Steven Peng, NP [name],  [name] [id number] PCP: Steven Peng, NP   Assessment & Plan: Visit Diagnoses:  1. Unilateral primary osteoarthritis, left knee     Plan: I was able to aspirate about 20 cc of fluid from the knee on the right side.  This was consistent with arthritis.  I then placed a steroid injection in his knee which he tolerated well.  All question concerns were answered and addressed.  Follow-up will be as needed.  Follow-Up Instructions: Return if symptoms worsen or fail to improve.   Orders:  Orders Placed This Encounter  Procedures  . Large Joint Inj   No orders of the defined types were placed in this encounter.     Procedures: Large Joint Inj: L knee on 06/26/2019 8:50 AM Indications: diagnostic evaluation and pain Details: 22 G 1.5 in needle, superolateral approach  Arthrogram: No  Medications: 3 mL lidocaine 1 %; 40 mg methylPREDNISolone acetate 40 MG/ML Outcome: tolerated well, no immediate complications Procedure, treatment alternatives, risks and benefits explained, specific risks discussed. Consent was given by the patient. Immediately prior to procedure a time out was called to verify the correct patient, procedure, equipment, support staff and site/side marked as required. Patient was prepped and draped in the usual sterile fashion.       Clinical Data: No additional findings.   Subjective: Chief Complaint  Patient presents with  . Left Knee - Pain  DJ is a 53 year old male that I have known since my childhood.  He has arthritis of his left knee with recurrent effusions.  I seen him before and aspirated his knee.  Today said he would like to have a steroid injection in his knee and aspiration.  He has had no other acute changes in medical status.  The knee has  been flaring up on him recently and starting with pivoting activities.  HPI  Review of Systems He currently denies a headache, chest pain, shortness of breath, fever, chills, nausea, vomiting  Objective: Vital Signs: There were no vitals taken for this visit.  Physical Exam Is alert and orient x3 and in no acute distress Ortho Exam Examination of his left knee shows a mild effusion.  He has got good range of motion the knee with slight varus malalignment.  The knee is ligamentously stable.  There is patellofemoral crepitation.  There is mainly medial joint line tenderness. Specialty Comments:  No specialty comments available.  Imaging: No results found.   PMFS History: Patient Active Problem List   Diagnosis Date Noted  . Unilateral primary osteoarthritis, left knee 04/25/2018  . History of gout 08/26/2014  . Routine general medical examination at a health care facility 08/26/2014  . Left hip pain 06/15/2011   Past Medical History:  Diagnosis Date  . Allergy   . Gout     Family History  Adopted: Yes  Problem Relation Age of Onset  . Colon cancer Neg Hx     Past Surgical History:  Procedure Laterality Date  . KNEE ARTHROSCOPY     right knee  . WISDOM TOOTH EXTRACTION     Social History   Occupational History  . Not on file  Tobacco Use  . Smoking status: Former Research scientist (life sciences)  . Smokeless tobacco: Never Used  Substance and Sexual Activity  . Alcohol use: Yes    Alcohol/week: 2.0 standard drinks    Types: 2 Shots of liquor per week    Comment: weekends only  . Drug use: No  . Sexual activity: Not on file

## 2019-07-08 ENCOUNTER — Other Ambulatory Visit: Payer: Self-pay | Admitting: Adult Health

## 2019-07-08 DIAGNOSIS — Z8739 Personal history of other diseases of the musculoskeletal system and connective tissue: Secondary | ICD-10-CM

## 2019-08-07 ENCOUNTER — Ambulatory Visit: Payer: BC Managed Care – PPO | Attending: Internal Medicine

## 2019-08-07 DIAGNOSIS — Z20822 Contact with and (suspected) exposure to covid-19: Secondary | ICD-10-CM | POA: Diagnosis not present

## 2019-08-08 LAB — NOVEL CORONAVIRUS, NAA: SARS-CoV-2, NAA: DETECTED — AB

## 2019-08-09 ENCOUNTER — Telehealth: Payer: Self-pay | Admitting: Unknown Physician Specialty

## 2019-08-09 NOTE — Telephone Encounter (Signed)
Called to discuss with patient about Covid symptoms and the use of a monoclonal antibody infusion for those with mild to moderate Covid symptoms and at a high risk of hospitalization.  Pt is qualified for this infusion at the [facility] due to BMI>35   Message left to call back  

## 2019-08-14 ENCOUNTER — Encounter: Payer: Self-pay | Admitting: Adult Health

## 2019-08-15 ENCOUNTER — Emergency Department (HOSPITAL_COMMUNITY): Payer: BC Managed Care – PPO

## 2019-08-15 ENCOUNTER — Encounter (HOSPITAL_COMMUNITY): Payer: Self-pay | Admitting: *Deleted

## 2019-08-15 ENCOUNTER — Telehealth (INDEPENDENT_AMBULATORY_CARE_PROVIDER_SITE_OTHER): Payer: BC Managed Care – PPO | Admitting: Adult Health

## 2019-08-15 ENCOUNTER — Emergency Department (HOSPITAL_COMMUNITY)
Admission: EM | Admit: 2019-08-15 | Discharge: 2019-08-15 | Disposition: A | Discharge status: Home / Self Care | Payer: BC Managed Care – PPO | Attending: Emergency Medicine | Admitting: Emergency Medicine

## 2019-08-15 ENCOUNTER — Other Ambulatory Visit: Payer: Self-pay

## 2019-08-15 DIAGNOSIS — U071 COVID-19: Secondary | ICD-10-CM | POA: Diagnosis not present

## 2019-08-15 DIAGNOSIS — Z79899 Other long term (current) drug therapy: Secondary | ICD-10-CM | POA: Insufficient documentation

## 2019-08-15 DIAGNOSIS — R030 Elevated blood-pressure reading, without diagnosis of hypertension: Secondary | ICD-10-CM | POA: Insufficient documentation

## 2019-08-15 DIAGNOSIS — J129 Viral pneumonia, unspecified: Secondary | ICD-10-CM | POA: Diagnosis not present

## 2019-08-15 DIAGNOSIS — R509 Fever, unspecified: Secondary | ICD-10-CM | POA: Insufficient documentation

## 2019-08-15 DIAGNOSIS — R0600 Dyspnea, unspecified: Secondary | ICD-10-CM

## 2019-08-15 DIAGNOSIS — R0602 Shortness of breath: Secondary | ICD-10-CM | POA: Diagnosis not present

## 2019-08-15 DIAGNOSIS — Z87891 Personal history of nicotine dependence: Secondary | ICD-10-CM | POA: Insufficient documentation

## 2019-08-15 DIAGNOSIS — R05 Cough: Secondary | ICD-10-CM | POA: Diagnosis not present

## 2019-08-15 LAB — COMPREHENSIVE METABOLIC PANEL
ALT: 40 U/L (ref 0–44)
AST: 36 U/L (ref 15–41)
Albumin: 3.6 g/dL (ref 3.5–5.0)
Alkaline Phosphatase: 46 U/L (ref 38–126)
Anion gap: 8 (ref 5–15)
BUN: 12 mg/dL (ref 6–20)
CO2: 22 mmol/L (ref 22–32)
Calcium: 8 mg/dL — ABNORMAL LOW (ref 8.9–10.3)
Chloride: 107 mmol/L (ref 98–111)
Creatinine, Ser: 1.09 mg/dL (ref 0.61–1.24)
GFR calc Af Amer: >60 mL/min (ref >60)
GFR calc non Af Amer: >60 mL/min (ref >60)
Glucose, Bld: 115 mg/dL — ABNORMAL HIGH (ref 70–99)
Potassium: 3.8 mmol/L (ref 3.5–5.1)
Sodium: 137 mmol/L (ref 135–145)
Total Bilirubin: 0.8 mg/dL (ref 0.3–1.2)
Total Protein: 7 g/dL (ref 6.5–8.1)

## 2019-08-15 LAB — CBC WITH DIFFERENTIAL/PLATELET
Abs Immature Granulocytes: 0 10*3/uL (ref 0.00–0.07)
Basophils Absolute: 0.1 10*3/uL (ref 0.0–0.1)
Basophils Relative: 2 %
Eosinophils Absolute: 0 10*3/uL (ref 0.0–0.5)
Eosinophils Relative: 0 %
HCT: 50.5 % (ref 39.0–52.0)
Hemoglobin: 17.2 g/dL — ABNORMAL HIGH (ref 13.0–17.0)
Lymphocytes Relative: 19 %
Lymphs Abs: 0.5 10*3/uL — ABNORMAL LOW (ref 0.7–4.0)
MCH: 30.3 pg (ref 26.0–34.0)
MCHC: 34.1 g/dL (ref 30.0–36.0)
MCV: 89.1 fL (ref 80.0–100.0)
Monocytes Absolute: 0.2 10*3/uL (ref 0.1–1.0)
Monocytes Relative: 6 %
Neutro Abs: 2 10*3/uL (ref 1.7–7.7)
Neutrophils Relative %: 73 %
Platelets: 174 10*3/uL (ref 150–400)
RBC: 5.67 MIL/uL (ref 4.22–5.81)
RDW: 13.1 % (ref 11.5–15.5)
WBC: 2.8 10*3/uL — ABNORMAL LOW (ref 4.0–10.5)
nRBC: 0 % (ref 0.0–0.2)
nRBC: 0 /100 WBC

## 2019-08-15 MED ORDER — SODIUM CHLORIDE 0.9 % IV BOLUS
500.0000 mL | Freq: Once | INTRAVENOUS | Status: AC
  Administered 2019-08-15: 500 mL via INTRAVENOUS

## 2019-08-15 MED ORDER — ALBUTEROL SULFATE HFA 108 (90 BASE) MCG/ACT IN AERS
1.0000 | INHALATION_SPRAY | RESPIRATORY_TRACT | Status: DC | PRN
  Administered 2019-08-15: 2 via RESPIRATORY_TRACT
  Filled 2019-08-15: qty 6.7

## 2019-08-15 MED ORDER — BENZONATATE 100 MG PO CAPS: 100.0000 mg | ORAL_CAPSULE | Freq: Three times a day (TID) | ORAL | 0 refills | Status: DC

## 2019-08-15 MED ORDER — DEXAMETHASONE SODIUM PHOSPHATE 10 MG/ML IJ SOLN
10.0000 mg | Freq: Once | INTRAMUSCULAR | Status: AC
  Administered 2019-08-15: 10 mg via INTRAVENOUS
  Filled 2019-08-15: qty 1

## 2019-08-15 MED ORDER — PREDNISONE 10 MG PO TABS: 40.0000 mg | ORAL_TABLET | Freq: Every day | ORAL | 0 refills | Status: AC

## 2019-08-15 NOTE — Discharge Instructions (Addendum)
You have been diagnosed today with COVID-19, viral pneumonia.  At this time there does not appear to be the presence of an emergent medical condition, however there is always the potential for conditions to change. Please read and follow the below instructions.  Please return to the Emergency Department immediately for any new or worsening symptoms. Please be sure to follow up with your Primary Care Provider within one week regarding your visit today; please call their office to schedule an appointment even if you are feeling better for a follow-up visit. You may begin taking the medication prednisone as prescribed to help with your symptoms.  Take this medication in the morning as it may make you feel jittery. You may use the albuterol inhaler given to you today to help with your symptoms. You may take the medication Tessalon as prescribed to help with your cough. Please drink plenty of water get plenty of rest.  Continue using your pulse oximeter at home to monitor your oxygen levels. Additionally as we discussed your EKG today had some incidental findings such as right axis deviation and borderline T wave abnormalities.  Please call the specialist at [facility] to schedule a follow-up appointment. Additionally your blood pressure was elevated today.  Please have your blood pressure rechecked at your primary care doctor's office within 1 week and discuss medication management if indicated.  Get help right away if: You have trouble breathing. You have pain or pressure in your chest. You have confusion. Get a very bad headache. Start to feel mixed up (confused). Feel weak or numb. Feel faint. Have very bad pain in your: Chest. Belly (abdomen). Throw up more than once. You have bluish lips and fingernails. You have difficulty waking from sleep. You have any new/concerning or worsening of symptoms  Please read the additional information packets attached to your discharge summary.  Do not  take your medicine if  develop an itchy rash, swelling in your mouth or lips, or difficulty breathing; call 911 and seek immediate emergency medical attention if this occurs.  Note: Portions of this text may have been transcribed using voice recognition software. Every effort was made to ensure accuracy; however, inadvertent computerized transcription errors may still be present.

## 2019-08-15 NOTE — ED Provider Notes (Signed)
[facility] EMERGENCY DEPARTMENT Provider Note   CSN: [id number] Arrival date & time: 08/15/19  1107     History Chief Complaint  Patient presents with  . Shortness of Breath    Steven Peters is a 54 y.o. male with history of gout, obesity, otherwise healthy.  Patient presents today in his eighth day of illness, reports gradual onset body aches, fever/chills, decreased p.o. intake and shortness of breath on 08/06/2018.  He tested positive for COVID-19 viral infection that same day.  He reports that all of his family members have also tested positive for COVID-19 viral infection.  Patient describes body aches as mild diffuse generalized aching sensation nonradiating no aggravating or alleviating factors.  He reports shortness of breath has only with walking up stairs, improved with a brief rest after.  He denies any shortness of breath at rest or with walking around the ground level of his home.  Patient reports cough is a constant nonproductive moderate intensity cough no alleviating factors, no medications prior to arrival.  Of note patient has a home pulse oximeter which she reports is never read below 93%.  He denies any headache, vision changes, neck pain/stiffness, sore throat, abdominal pain, vomiting, diarrhea, extremity swelling/color change, chest pain, hemoptysis or any additional concerns.  HPI     Past Medical History:  Diagnosis Date  . Allergy   . Gout     Patient Active Problem List   Diagnosis Date Noted  . Unilateral primary osteoarthritis, left knee 04/25/2018  . History of gout 08/26/2014  . Routine general medical examination at a health care facility 08/26/2014  . Left hip pain 06/15/2011    Past Surgical History:  Procedure Laterality Date  . KNEE ARTHROSCOPY     right knee  . WISDOM TOOTH EXTRACTION         Family History  Adopted: Yes  Problem Relation Age of Onset  . Colon cancer Neg Hx     Social History    Tobacco Use  . Smoking status: Former Research scientist (life sciences)  . Smokeless tobacco: Never Used  Substance Use Topics  . Alcohol use: Yes    Alcohol/week: 2.0 standard drinks    Types: 2 Shots of liquor per week    Comment: weekends only  . Drug use: No    Home Medications Prior to Admission medications   Medication Sig Start Date End Date Taking? Authorizing Provider  allopurinol (ZYLOPRIM) 300 MG tablet TAKE 1 TABLET(300 MG) BY MOUTH DAILY 07/09/19   Nafziger, Tommi Rumps, NP  benzonatate (TESSALON) 100 MG capsule Take 1 capsule (100 mg total) by mouth every 8 (eight) hours. 08/15/19   Nuala Alpha A, PA-C  ibuprofen (ADVIL) 800 MG tablet TK 1 T PO  TID WITH FOOD PRF PAIN AND SWELLING 02/25/19   [provider]  predniSONE (DELTASONE) 10 MG tablet Take 4 tablets (40 mg total) by mouth daily for 5 days. 08/15/19 08/20/19  Deliah Boston, PA-C    Allergies    Patient has no known allergies.  Review of Systems   Review of Systems Ten systems are reviewed and are negative for acute change except as noted in the HPI  Physical Exam Updated Vital Signs BP (!) 149/102 (BP Location: Right Arm)   Pulse 97   Temp 98.4 F (36.9 C) (Oral)   Resp 20   Ht 6' (1.829 m)   Wt 127 kg   SpO2 93%   BMI 37.97 kg/m   Physical Exam Constitutional:  General: He is not in acute distress.    Appearance: Normal appearance. He is well-developed. He is obese. He is not ill-appearing or diaphoretic.  HENT:     Head: Normocephalic and atraumatic.     Right Ear: External ear normal.     Left Ear: External ear normal.     Nose: Nose normal.  Eyes:     General: Vision grossly intact. Gaze aligned appropriately.     Pupils: Pupils are equal, round, and reactive to light.  Neck:     Trachea: Trachea and phonation normal. No tracheal deviation.  Cardiovascular:     Rate and Rhythm: Normal rate and regular rhythm.     Pulses:          Dorsalis pedis pulses are 2+ on the right side and 2+ on the left  side.     Heart sounds: Normal heart sounds.  Pulmonary:     Effort: Pulmonary effort is normal. No accessory muscle usage or respiratory distress.     Breath sounds: Normal breath sounds.  Chest:     Chest wall: No deformity, tenderness or crepitus.  Abdominal:     General: There is no distension.     Palpations: Abdomen is soft.     Tenderness: There is no abdominal tenderness. There is no guarding or rebound.  Musculoskeletal:        General: Normal range of motion.     Cervical back: Normal range of motion.     Right lower leg: No tenderness. No edema.     Left lower leg: No tenderness. No edema.  Skin:    General: Skin is warm and dry.  Neurological:     Mental Status: He is alert.     GCS: GCS eye subscore is 4. GCS verbal subscore is 5. GCS motor subscore is 6.     Comments: Speech is clear and goal oriented, follows commands Major Cranial nerves without deficit, no facial droop Moves extremities without ataxia, coordination intact  Psychiatric:        Behavior: Behavior normal.     ED Results / Procedures / Treatments   Labs (all labs ordered are listed, but only abnormal results are displayed) Labs Reviewed  CBC WITH DIFFERENTIAL/PLATELET - Abnormal; Notable for the following components:      Result Value   WBC 2.8 (*)    Hemoglobin 17.2 (*)    Lymphs Abs 0.5 (*)    All other components within normal limits  COMPREHENSIVE METABOLIC PANEL - Abnormal; Notable for the following components:   Glucose, Bld 115 (*)    Calcium 8.0 (*)    All other components within normal limits    EKG EKG Interpretation  Date/Time:  Thursday August 15 2019 11:20:19 EST Ventricular Rate:  101 PR Interval:    QRS Duration: 104 QT Interval:  353 QTC Calculation: 458 R Axis:   100 Text Interpretation: Sinus tachycardia Right axis deviation Probable anteroseptal infarct, old Borderline T abnormalities, inferior leads No old tracing to compare Confirmed by Isla Pence [phone])  on 08/15/2019 11:22:16 AM   Radiology DG Chest Portable 1 View  Result Date: 08/15/2019 CLINICAL DATA:  Cough and shortness of breath. COVID positive. EXAM: PORTABLE CHEST 1 VIEW COMPARISON:  None. FINDINGS: The cardiac silhouette, mediastinal and hilar contours are within normal limits given the AP projection and portable technique. Low lung volumes with mild vascular crowding and streaky atelectasis. Possible patchy vague infiltrates which certainly can be seen with COVID pneumonia. IMPRESSION:  1. Low lung volumes with vascular crowding and streaky atelectasis. 2. Possible patchy infiltrates. Electronically Signed   By: Marijo Sanes M.D.   On: 08/15/2019 12:00    Procedures Procedures (including critical care time)  Medications Ordered in ED Medications  albuterol (VENTOLIN HFA) 108 (90 Base) MCG/ACT inhaler 1-2 puff (has no administration in time range)  dexamethasone (DECADRON) injection 10 mg (10 mg Intravenous Given 08/15/19 1216)  sodium chloride 0.9 % bolus 500 mL (500 mLs Intravenous New Bag/Given 08/15/19 1216)    ED Course  I have reviewed the triage vital signs and the nursing notes.  Pertinent labs & imaging results that were available during my care of the patient were reviewed by me and considered in my medical decision making (see chart for details).  Clinical Course as of Aug 14 1356  Thu Aug 15, 2019  1256 Prednisone, Albuterol, Cardiology referral   [BM]    Clinical Course User Index [BM] Gari Crown   MDM Rules/Calculators/A&P                     54 year old male Covid positive on eighth day of illness presents today with cough, shortness of breath, body aches, low-grade fever and decreased appetite.  His primary concern is his cough he describes as nonproductive, no hemoptysis.  He also reports shortness of breath only with walking up stairs in his home, no shortness of breath with ambulation on flat surface.  He denies any chest pain or abdominal  pain.  On initial evaluation he is well-appearing no acute distress, cranial nerves intact, no meningeal signs, airway clear, lungs clear to auscultation bilaterally, heart regular rate and rhythm without murmur, abdomen soft nontender without peritoneal signs, neurovascular tact to all 4 extremities without evidence of DVT.  Will obtain basic lab work, EKG and chest x-ray.  SPO2 throughout my evaluation was 95% on room air, is not requiring any supplemental oxygen at this time.  Will have nursing staff ambulate the patient and give fluid bolus and decadron.  I reviewed patient's medical record which confirms positive Covid test on 08/07/2019. - CBC shows leukopenia of 2.8 which is consistent with known COVID-19 infection, hemoglobin 17.2 appears consistent with prior  CMP glucose 115, calcium 8.0, no acute findings  Chest x-ray:  IMPRESSION:  1. Low lung volumes with vascular crowding and streaky atelectasis.  2. Possible patchy infiltrates.   EKG: Sinus tachycardia Right axis deviation Probable anteroseptal infarct, old Borderline T abnormalities, inferior leads No old tracing to compare Confirmed by Isla Pence [phone]) on 08/15/2019 11:22:16 AM  Reviewed EKG with Dr. Gilford Raid, no acute ischemic findings, there is no indication for troponin or further work-up, will give outpatient referral to cardiology. --- Patient was ambulated by nursing staff without assistance or difficulty.  Pulse oximetry stayed 95% on room air throughout and he denied any shortness of breath.  Work-up as above is consistent with known COVID-19 viral infection.  Patchy infiltrates seen on chest x-ray today likely due to COVID-19, I personally reviewed chest x-ray and agree with radiologist interpretation.  Based on history of present illness and work-up above suspect viral etiology of symptoms today doubt bacterial infection requiring antibiotics. - 1:30 PM: I reassessed the patient he is resting comfortably no acute  distress reports that he is feeling well.  I updated him on findings as above and he states understanding.  Plan of care at this time is to discharge the patient with symptomatic treatments of prednisone  burst x5 days, albuterol inhaler as needed and follow-up with PCP.  Additionally will give Tessalon for cough.  Advised him to continue monitoring pulse oximetry at home and to return to the ER if he has any new or worsening symptoms.  I additionally advised him on EKG findings and informed him to call heart care to schedule a follow-up appointment and he states understanding.  He denies any chest pain or shortness of breath on reevaluation.  Additionally patient with single elevated blood pressure reading today.  Asymptomatic regarding updated blood pressure reading today.  He has been informed of this and to follow-up with PCP for blood pressure recheck and medication management if indicated within 1 week.  Informed of signs and symptoms of hypertensive emergency and emergency and to return to the ER if they occur.  At this time there does not appear to be any evidence of an acute emergency medical condition and the patient appears stable for discharge with appropriate outpatient follow up. Diagnosis was discussed with patient who verbalizes understanding of care plan and is agreeable to discharge. I have discussed return precautions with patient who verbalizes understanding of return precautions. Patient encouraged to follow-up with their PCP and Cardiology. All questions answered.  Patient's case discussed with Dr. Gilford Raid who agrees with plan to discharge with follow-up.   Steven Peters was evaluated in Emergency Department on 08/15/2019 for the symptoms described in the history of present illness. He was evaluated in the context of the global COVID-19 pandemic, which necessitated consideration that the patient might be at risk for infection with the SARS-CoV-2 virus that causes COVID-19. Institutional  protocols and algorithms that pertain to the evaluation of patients at risk for COVID-19 are in a state of rapid change based on information released by regulatory bodies including the CDC and federal and state organizations. These policies and algorithms were followed during the patient's care in the ED.  Note: Portions of this report may have been transcribed using voice recognition software. Every effort was made to ensure accuracy; however, inadvertent computerized transcription errors may still be present. Final Clinical Impression(s) / ED Diagnoses Final diagnoses:  [id number] virus infection  Viral pneumonia    Rx / DC Orders ED Discharge Orders         Ordered    predniSONE (DELTASONE) 10 MG tablet  Daily     08/15/19 1357    benzonatate (TESSALON) 100 MG capsule  Every 8 hours     08/15/19 1357           Gari Crown 08/15/19 1401    Isla Pence, MD 08/15/19 1502

## 2019-08-15 NOTE — Progress Notes (Signed)
Virtual Visit via Telephone Note  I connected with Steven Peters on 08/15/19 at 10:30 AM EST by telephone and verified that I am speaking with the correct person using two identifiers.   I discussed the limitations, risks, security and privacy concerns of performing an evaluation and management service by telephone and the availability of in person appointments. I also discussed with the patient that there may be a patient responsible charge related to this service. The patient expressed understanding and agreed to proceed.  Location patient: home Location provider: work or home office Participants present for the call: patient, provider Patient did not have a visit in the prior 7 days to address this/these issue(s).   History of Present Illness: 54 year old male who tested positive for Covid on 08/07/2019.  Testing positive he is developed shortness of breath that is worse in the morning with and without exertion as well as a constant dry cough.  He does endorse low-grade fever up to 100.7 and chills on a daily basis.  He has been monitoring his oxygen saturations at home and reports readings of 93 to 95%.  Been taking ibuprofen as needed and feels better after ibuprofen but this is short-lived.  Denies nausea or vomiting has not had any chest pain.   Observations/Objective: Patient sounds cheerful and well on the phone. I do not appreciate any SOB. Speech and thought processing are grossly intact. Patient reported vitals:  Assessment and Plan: 1. Dyspnea due to COVID-19 Due to symptoms he will need a chest x-ray to rule out pneumonia and secondary infection from Covid.  He does meet Covid protocol due to symptoms and oxygen saturation.  Advised to follow-up at [facility] emergency room for further evaluation  Follow Up Instructions:  I did not refer this patient for an OV in the next 24 hours for this/these issue(s).  I discussed the assessment and treatment plan with the patient. The  patient was provided an opportunity to ask questions and all were answered. The patient agreed with the plan and demonstrated an understanding of the instructions.   The patient was advised to call back or seek an in-person evaluation if the symptoms worsen or if the condition fails to improve as anticipated.  I provided 22 minutes of non-face-to-face time during this encounter.   [name], NP

## 2019-08-15 NOTE — ED Notes (Signed)
Pt ambulated with steady gait and no assistance needed. Pt ambulated with pulse ox and was 95% on RA. Pt stated, "I only get short of breath when I go up stairs." Pt had no SOB when ambulating however RR were 29.

## 2019-08-22 ENCOUNTER — Encounter: Payer: Self-pay | Admitting: *Deleted

## 2019-08-22 ENCOUNTER — Telehealth: Payer: Self-pay

## 2019-08-22 ENCOUNTER — Encounter: Payer: Self-pay | Admitting: Cardiology

## 2019-08-22 ENCOUNTER — Other Ambulatory Visit: Payer: Self-pay

## 2019-08-22 ENCOUNTER — Telehealth (INDEPENDENT_AMBULATORY_CARE_PROVIDER_SITE_OTHER): Payer: BC Managed Care – PPO | Admitting: Cardiology

## 2019-08-22 VITALS — Ht 72.0 in | Wt 270.0 lb

## 2019-08-22 DIAGNOSIS — J1282 Pneumonia due to coronavirus disease 2019: Secondary | ICD-10-CM | POA: Diagnosis not present

## 2019-08-22 DIAGNOSIS — R0602 Shortness of breath: Secondary | ICD-10-CM | POA: Diagnosis not present

## 2019-08-22 DIAGNOSIS — U071 COVID-19: Secondary | ICD-10-CM

## 2019-08-22 NOTE — Progress Notes (Signed)
Virtual Visit via Telephone Note   This visit type was conducted due to national recommendations for restrictions regarding the COVID-19 Pandemic (e.g. social distancing) in an effort to limit this patient's exposure and mitigate transmission in our community.  Due to his co-morbid illnesses, this patient is at least at moderate risk for complications without adequate follow up.  This format is felt to be most appropriate for this patient at this time.  The patient did not have access to video technology/had technical difficulties with video requiring transitioning to audio format only (telephone).  All issues noted in this document were discussed and addressed.  No physical exam could be performed with this format.  Please refer to the patient's chart for his  consent to telehealth for [facility].   Date:  08/22/2019   ID:  [name], DOB 1966-03-05, MRN [id number]  Patient Location: Home Provider Location: Home  PCP:  [name], NP  Cardiologist:  Ena Dawley, MD  Evaluation Performed:  New Patient Evaluation  Chief Complaint:  SOB  History of Present Illness:    Steven Peters is a 54 y.o. male with h/o gout, no prior medical history of heart disease who was diagnosed with Covid 19 infection on 08/07/2019 and presented to ER on 08/15/2019 with SOB. He was diagnosed with pneumonia and started on steroids. He is now significantly improved, able to walk his dog.  Prior to Covid he was fairly active and free of chest pain or SOB.  He smoked many years ago, FH is unknown as he is adopted, but he follows with his PCP on an annual bases. He works for [employer] and has traveled to [place] frequently prior to [employer].  The patient does have symptoms concerning for COVID-19 infection (fever, chills, cough, or new shortness of breath).    Past Medical History:  Diagnosis Date  . Allergy   . Cough   . Gout   . History of gout 08/26/2014  . Hypertension   . Left hip  pain 06/15/2011  . Obesity   . SOB (shortness of breath)   . Unilateral primary osteoarthritis, left knee 04/25/2018   Past Surgical History:  Procedure Laterality Date  . KNEE ARTHROSCOPY     right knee  . WISDOM TOOTH EXTRACTION       Current Meds  Medication Sig  . allopurinol (ZYLOPRIM) 300 MG tablet TAKE 1 TABLET(300 MG) BY MOUTH DAILY     Allergies:   Patient has no known allergies.   Social History   Tobacco Use  . Smoking status: Former Research scientist (life sciences)  . Smokeless tobacco: Never Used  Substance Use Topics  . Alcohol use: Yes    Alcohol/week: 2.0 standard drinks    Types: 2 Shots of liquor per week    Comment: weekends only  . Drug use: No     Family Hx: The patient's family history is negative for Colon cancer. He was adopted.  ROS:   Please see the history of present illness.    All other systems reviewed and are negative.   Prior CV studies:   The following studies were reviewed today:  None prior echo  Labs/Other Tests and Data Reviewed:    EKG:  An ECG dated 08/15/2019 was personally reviewed today and demonstrated:  sinus tachycardia  Recent Labs: 06/13/2019: TSH 2.23 08/15/2019: ALT 40; BUN 12; Creatinine, Ser 1.09; Hemoglobin 17.2; Platelets 174; Potassium 3.8; Sodium 137   Recent Lipid Panel Lab Results  Component Value Date/Time  CHOL 177 06/13/2019 11:22 AM   TRIG 68.0 06/13/2019 11:22 AM   HDL 45.60 06/13/2019 11:22 AM   CHOLHDL 4 06/13/2019 11:22 AM   LDLCALC 118 (H) 06/13/2019 11:22 AM   Wt Readings from Last 3 Encounters:  08/22/19 270 lb (122.5 kg)  08/15/19 280 lb (127 kg)  06/13/19 290 lb (131.5 kg)    Objective:    Vital Signs:  Ht 6' (1.829 m)   Wt 270 lb (122.5 kg)   BMI 36.62 kg/m    VITAL SIGNS:  reviewed  ASSESSMENT & PLAN:    1. SOB - sec to Covid pneumonia, we will obtain an echocardiogram given frequent myocardial damage with symptomatic Covid 19 infection. 2. We will follow in 2-3 months, if he continues to have  symptoms we will consider coronary CTA.  3. Hyperlipidemia - borderline, we will discuss at the next visit, consider calcium scoring.   COVID-19 Education: The signs and symptoms of COVID-19 were discussed with the patient and how to seek care for testing (follow up with PCP or arrange E-visit).  The importance of social distancing was discussed today.  Time:   Today, I have spent 25 minutes with the patient with telehealth technology discussing the above problems.     Medication Adjustments/Labs and Tests Ordered: Current medicines are reviewed at length with the patient today.  Concerns regarding medicines are outlined above.   Tests Ordered: No orders of the defined types were placed in this encounter.   Medication Changes: No orders of the defined types were placed in this encounter.   Follow Up:  In Person in 2 month(s)  Signed, Ena Dawley, MD  08/22/2019 11:25 AM    [place]

## 2019-08-22 NOTE — Patient Instructions (Signed)
Medication Instructions:    Your physician recommends that you continue on your current medications as directed. Please refer to the Current Medication list given to you today.  *If you need a refill on your cardiac medications before your next appointment, please call your pharmacy*    Testing/Procedures:  Your physician has requested that you have an echocardiogram. Echocardiography is a painless test that uses sound waves to create images of your heart. It provides your doctor with information about the size and shape of your heart and how well your heart's chambers and valves are working. This procedure takes approximately one hour. There are no restrictions for this procedure.  OUR SCHEDULING DEPARTMENT WILL BE CALLING YOU IN THE VERY NEAR FUTURE TO SCHEDULE THIS APPOINTMENT TO BE DONE IN OUR OFFICE   Follow-Up:  3 MONTHS WITH DR. Meda Coffee IN PERSON IN THE OFFICE ON Nov 14, 2019 AT 11:40 AM

## 2019-08-22 NOTE — Addendum Note (Signed)
Addended by: Nuala Alpha on: 08/22/2019 11:47 AM   Modules accepted: Orders

## 2019-08-22 NOTE — Telephone Encounter (Signed)

## 2019-09-10 ENCOUNTER — Other Ambulatory Visit: Payer: Self-pay

## 2019-09-10 ENCOUNTER — Ambulatory Visit (HOSPITAL_COMMUNITY): Payer: BC Managed Care – PPO | Attending: Cardiology

## 2019-09-10 DIAGNOSIS — U071 COVID-19: Secondary | ICD-10-CM | POA: Insufficient documentation

## 2019-09-10 DIAGNOSIS — J1282 Pneumonia due to coronavirus disease 2019: Secondary | ICD-10-CM | POA: Insufficient documentation

## 2019-09-10 DIAGNOSIS — R0602 Shortness of breath: Secondary | ICD-10-CM | POA: Insufficient documentation

## 2019-11-14 ENCOUNTER — Ambulatory Visit: Payer: BC Managed Care – PPO | Admitting: Cardiology

## 2020-02-03 ENCOUNTER — Ambulatory Visit: Payer: Self-pay

## 2020-02-03 ENCOUNTER — Encounter: Payer: Self-pay | Admitting: Orthopaedic Surgery

## 2020-02-03 ENCOUNTER — Ambulatory Visit (INDEPENDENT_AMBULATORY_CARE_PROVIDER_SITE_OTHER): Payer: BC Managed Care – PPO | Admitting: Orthopaedic Surgery

## 2020-02-03 VITALS — Ht 72.0 in | Wt 280.0 lb

## 2020-02-03 DIAGNOSIS — G8929 Other chronic pain: Secondary | ICD-10-CM

## 2020-02-03 DIAGNOSIS — M25561 Pain in right knee: Secondary | ICD-10-CM | POA: Diagnosis not present

## 2020-02-03 NOTE — Progress Notes (Signed)
Office Visit Note   Patient: Steven Peters           Date of Birth: 1965/12/13           MRN: [id number] Visit Date: 02/03/2020              Requested by: [name], NP [address],  [place] [address] PCP: [name], NP   Assessment & Plan: Visit Diagnoses:  1. Chronic pain of right knee     Plan: The x-rays of his right knee shows no acute findings.  We talked about trying a steroid injection in his knee and he agreed with this and tolerated it well.  Having had steroid injection before he is fully aware of the risk and benefits of injections.  All questions and concerns were answered and addressed.  Follow-up can be as needed.  However, if things worsen at all he will let me know.  My neck step would be to obtain an MRI of that knee if he develops mechanical symptoms.  Follow-Up Instructions: Return if symptoms worsen or fail to improve.   Orders:  Orders Placed This Encounter  Procedures  . XR Knee 1-2 Views Right   No orders of the defined types were placed in this encounter.     Procedures: No procedures performed   Clinical Data: No additional findings.   Subjective: Chief Complaint  Patient presents with  . Right Knee - Pain  The patient is a friend of mine.  He comes in today with an acute history of knee pain.  He feels like he injured that knee about 2 weeks ago when [place] soft and hard [place].  He had a lot of grinding when getting up and some popping in the knee was swollen.  However it is now feeling better.  I did see him last year for his left knee with an aspiration and cortisone injection.  In that knee got significantly better.  He is never had surgery on his right knee or an injury like this before.  He points to mainly the medial joint line is source of his pain but before it was more all over the knee.  Again he states that it is slowly getting better.  HPI  Review of Systems He currently denies any  headache, chest pain, shortness of breath, fever, chills, nausea, vomiting  Objective: Vital Signs: Ht 6' (1.829 m)   Wt 280 lb (127 kg)   BMI 37.97 kg/m   Physical Exam He is alert and oriented x3 and in no acute distress Ortho Exam 's examination of the right knee shows excellent range of motion of the knee with no effusion.  There is a little bit of lateral joint line tenderness but the knee feels ligamentously stable.  His McMurray's exam is negative. Specialty Comments:  No specialty comments available.  Imaging: No results found.   PMFS History: Patient Active Problem List   Diagnosis Date Noted  . Unilateral primary osteoarthritis, left knee 04/25/2018  . History of gout 08/26/2014  . Routine general medical examination at a health care facility 08/26/2014  . Left hip pain 06/15/2011   Past Medical History:  Diagnosis Date  . Allergy   . Cough   . Gout   . History of gout 08/26/2014  . Hypertension   . Left hip pain 06/15/2011  . Obesity   . SOB (shortness of breath)   . Unilateral primary osteoarthritis, left  knee 04/25/2018    Family History  Adopted: Yes  Problem Relation Age of Onset  . Colon cancer Neg Hx     Past Surgical History:  Procedure Laterality Date  . KNEE ARTHROSCOPY     right knee  . WISDOM TOOTH EXTRACTION     Social History   Occupational History  . Not on file  Tobacco Use  . Smoking status: Former Research scientist (life sciences)  . Smokeless tobacco: Never Used  Substance and Sexual Activity  . Alcohol use: Yes    Alcohol/week: 2.0 standard drinks    Types: 2 Shots of liquor per week    Comment: weekends only  . Drug use: No  . Sexual activity: Not on file

## 2020-02-12 DIAGNOSIS — Z20822 Contact with and (suspected) exposure to covid-19: Secondary | ICD-10-CM | POA: Diagnosis not present

## 2020-06-16 ENCOUNTER — Encounter: Payer: BC Managed Care – PPO | Admitting: Adult Health

## 2020-07-08 ENCOUNTER — Other Ambulatory Visit: Payer: Self-pay

## 2020-07-09 ENCOUNTER — Encounter: Payer: Self-pay | Admitting: Adult Health

## 2020-07-09 ENCOUNTER — Ambulatory Visit (INDEPENDENT_AMBULATORY_CARE_PROVIDER_SITE_OTHER): Payer: BC Managed Care – PPO | Admitting: Adult Health

## 2020-07-09 ENCOUNTER — Telehealth: Payer: Self-pay | Admitting: Adult Health

## 2020-07-09 VITALS — BP 126/88 | Temp 97.9°F | Wt 296.0 lb

## 2020-07-09 DIAGNOSIS — Z23 Encounter for immunization: Secondary | ICD-10-CM | POA: Diagnosis not present

## 2020-07-09 DIAGNOSIS — Z8739 Personal history of other diseases of the musculoskeletal system and connective tissue: Secondary | ICD-10-CM | POA: Diagnosis not present

## 2020-07-09 DIAGNOSIS — E668 Other obesity: Secondary | ICD-10-CM

## 2020-07-09 DIAGNOSIS — Z0000 Encounter for general adult medical examination without abnormal findings: Secondary | ICD-10-CM | POA: Diagnosis not present

## 2020-07-09 DIAGNOSIS — Z125 Encounter for screening for malignant neoplasm of prostate: Secondary | ICD-10-CM | POA: Diagnosis not present

## 2020-07-09 LAB — COMPREHENSIVE METABOLIC PANEL WITH GFR
ALT: 39 U/L (ref 0–53)
AST: 31 U/L (ref 0–37)
Albumin: 4.7 g/dL (ref 3.5–5.2)
Alkaline Phosphatase: 55 U/L (ref 39–117)
BUN: 16 mg/dL (ref 6–23)
CO2: 26 meq/L (ref 19–32)
Calcium: 9.7 mg/dL (ref 8.4–10.5)
Chloride: 104 meq/L (ref 96–112)
Creatinine, Ser: 1.24 mg/dL (ref 0.40–1.50)
GFR: 65.64 mL/min
Glucose, Bld: 95 mg/dL (ref 70–99)
Potassium: 4.8 meq/L (ref 3.5–5.1)
Sodium: 139 meq/L (ref 135–145)
Total Bilirubin: 0.8 mg/dL (ref 0.2–1.2)
Total Protein: 7.8 g/dL (ref 6.0–8.3)

## 2020-07-09 LAB — LIPID PANEL
Cholesterol: 201 mg/dL — ABNORMAL HIGH (ref 0–200)
HDL: 40.6 mg/dL (ref >39.00)
LDL Cholesterol: 122 mg/dL — ABNORMAL HIGH (ref 0–99)
NonHDL: 160.24
Total CHOL/HDL Ratio: 5
Triglycerides: 193 mg/dL — ABNORMAL HIGH (ref 0.0–149.0)
VLDL: 38.6 mg/dL (ref 0.0–40.0)

## 2020-07-09 LAB — TSH: TSH: 3.41 u[IU]/mL (ref 0.35–4.50)

## 2020-07-09 LAB — CBC WITH DIFFERENTIAL/PLATELET
Basophils Absolute: 0 10*3/uL (ref 0.0–0.1)
Basophils Relative: 0.7 % (ref 0.0–3.0)
Eosinophils Absolute: 0.3 10*3/uL (ref 0.0–0.7)
Eosinophils Relative: 4.7 % (ref 0.0–5.0)
HCT: 54.4 % — ABNORMAL HIGH (ref 39.0–52.0)
Hemoglobin: 18.8 g/dL (ref 13.0–17.0)
Lymphocytes Relative: 27.5 % (ref 12.0–46.0)
Lymphs Abs: 1.7 10*3/uL (ref 0.7–4.0)
MCHC: 34.5 g/dL (ref 30.0–36.0)
MCV: 92.2 fl (ref 78.0–100.0)
Monocytes Absolute: 0.5 10*3/uL (ref 0.1–1.0)
Monocytes Relative: 7.8 % (ref 3.0–12.0)
Neutro Abs: 3.8 10*3/uL (ref 1.4–7.7)
Neutrophils Relative %: 59.3 % (ref 43.0–77.0)
Platelets: 291 10*3/uL (ref 150.0–400.0)
RBC: 5.9 Mil/uL — ABNORMAL HIGH (ref 4.22–5.81)
RDW: 14.1 % (ref 11.5–15.5)
WBC: 6.4 10*3/uL (ref 4.0–10.5)

## 2020-07-09 LAB — PSA: PSA: 0.42 ng/mL (ref 0.10–4.00)

## 2020-07-09 MED ORDER — ALLOPURINOL 300 MG PO TABS: ORAL_TABLET | ORAL | 3 refills | Status: DC

## 2020-07-09 NOTE — Progress Notes (Signed)
Subjective:    Patient ID: Steven Peters, male    DOB: January 03, 1966, 55 y.o.   MRN: [id number]  HPI Patient presents for yearly preventative medicine examination. He is a pleasant 55 year old male who  has a past medical history of Allergy, Cough, Gout, History of gout (08/26/2014), Hypertension, Left hip pain (06/15/2011), Obesity, SOB (shortness of breath), and Unilateral primary osteoarthritis, left knee (04/25/2018).  H/o Gout - takes allopurinol 300 mg daily. He denies recent flares   Obesity - is trying to eat and control portions. He is drinking less alcohol. Reports " it is just being lazy".  Wt Readings from Last 8 Encounters:  07/09/20 296 lb (134.3 kg)  02/03/20 280 lb (127 kg)  08/22/19 270 lb (122.5 kg)  08/15/19 280 lb (127 kg)  06/13/19 290 lb (131.5 kg)  03/29/18 276 lb 12.8 oz (125.6 kg)  05/24/17 280 lb (127 kg)  03/28/17 283 lb (128.4 kg)   All immunizations and health maintenance protocols were reviewed with the patient and needed orders were placed. He is up to date on routine   Appropriate screening laboratory values were ordered for the patient including screening of hyperlipidemia, renal function and hepatic function. If indicated by BPH, a PSA was ordered.  Medication reconciliation,  past medical history, social history, problem list and allergies were reviewed in detail with the patient  Goals were established with regard to weight loss, exercise, and  diet in compliance with medications  He is due for colonoscopy in October 2022.   Review of Systems  Constitutional: Negative.   HENT: Negative.   Eyes: Negative.   Respiratory: Negative.   Cardiovascular: Negative.   Gastrointestinal: Negative.   Endocrine: Negative.   Genitourinary: Negative.   Musculoskeletal: Negative.   Skin: Negative.   Allergic/Immunologic: Negative.   Neurological: Negative.   Hematological: Negative.   Psychiatric/Behavioral: Negative.   All other systems reviewed and  are negative.  Past Medical History:  Diagnosis Date  . Allergy   . Cough   . Gout   . History of gout 08/26/2014  . Hypertension   . Left hip pain 06/15/2011  . Obesity   . SOB (shortness of breath)   . Unilateral primary osteoarthritis, left knee 04/25/2018    Social History   Socioeconomic History  . Marital status: Married    Spouse name: Not on file  . Number of children: Not on file  . Years of education: Not on file  . Highest education level: Not on file  Occupational History  . Not on file  Tobacco Use  . Smoking status: Former Games developer  . Smokeless tobacco: Never Used  Substance and Sexual Activity  . Alcohol use: Yes    Alcohol/week: 2.0 standard drinks    Types: 2 Shots of liquor per week    Comment: weekends only  . Drug use: No  . Sexual activity: Not on file  Other Topics Concern  . Not on file  Social History Narrative  . Not on file   Social Determinants of Health   Financial Resource Strain: Not on file  Food Insecurity: Not on file  Transportation Needs: Not on file  Physical Activity: Not on file  Stress: Not on file  Social Connections: Not on file  Intimate Partner Violence: Not on file    Past Surgical History:  Procedure Laterality Date  . KNEE ARTHROSCOPY     right knee  . WISDOM TOOTH EXTRACTION      Family History  Adopted: Yes  Problem Relation Age of Onset  . Colon cancer Neg Hx     No Known Allergies  Current Outpatient Medications on File Prior to Visit  Medication Sig Dispense Refill  . allopurinol (ZYLOPRIM) 300 MG tablet TAKE 1 TABLET(300 MG) BY MOUTH DAILY 90 tablet 3   No current facility-administered medications on file prior to visit.    BP (!) 126/96   Temp 97.9 F (36.6 C)   Wt 296 lb (134.3 kg)   BMI 40.14 kg/m       Objective:   Physical Exam Vitals and nursing note reviewed.  Constitutional:      General: He is not in acute distress.    Appearance: Normal appearance. He is well-developed. He  is obese.  HENT:     Head: Normocephalic and atraumatic.     Right Ear: Tympanic membrane, ear canal and external ear normal. There is no impacted cerumen.     Left Ear: Tympanic membrane, ear canal and external ear normal. There is no impacted cerumen.     Nose: Nose normal. No congestion or rhinorrhea.     Mouth/Throat:     Mouth: Mucous membranes are moist.     Pharynx: Oropharynx is clear. No oropharyngeal exudate or posterior oropharyngeal erythema.  Eyes:     General:        Right eye: No discharge.        Left eye: No discharge.     Extraocular Movements: Extraocular movements intact.     Conjunctiva/sclera: Conjunctivae normal.     Pupils: Pupils are equal, round, and reactive to light.  Neck:     Vascular: No carotid bruit.     Trachea: No tracheal deviation.  Cardiovascular:     Rate and Rhythm: Normal rate and regular rhythm.     Pulses: Normal pulses.     Heart sounds: Normal heart sounds. No murmur heard. No friction rub. No gallop.   Pulmonary:     Effort: Pulmonary effort is normal. No respiratory distress.     Breath sounds: Normal breath sounds. No stridor. No wheezing, rhonchi or rales.  Chest:     Chest wall: No tenderness.  Abdominal:     General: Bowel sounds are normal. There is no distension.     Palpations: Abdomen is soft. There is no mass.     Tenderness: There is no abdominal tenderness. There is no right CVA tenderness, left CVA tenderness, guarding or rebound.     Hernia: No hernia is present.  Musculoskeletal:        General: No swelling, tenderness, deformity or signs of injury. Normal range of motion.     Right lower leg: No edema.     Left lower leg: No edema.  Lymphadenopathy:     Cervical: No cervical adenopathy.  Skin:    General: Skin is warm and dry.     Capillary Refill: Capillary refill takes less than 2 seconds.     Coloration: Skin is not jaundiced or pale.     Findings: No bruising, erythema, lesion or rash.  Neurological:      General: No focal deficit present.     Mental Status: He is alert and oriented to person, place, and time.     Cranial Nerves: No cranial nerve deficit.     Sensory: No sensory deficit.     Motor: No weakness.     Coordination: Coordination normal.     Gait: Gait normal.     Deep  Tendon Reflexes: Reflexes normal.  Psychiatric:        Mood and Affect: Mood normal.        Behavior: Behavior normal.        Thought Content: Thought content normal.        Judgment: Judgment normal.       Assessment & Plan:  1. Routine general medical examination at a health care facility - Encouraged weight loss through diet and exercise - Follow up in one year or sooner if needed - CBC with Differential/Platelet; Future - Comprehensive metabolic panel; Future - Lipid panel; Future - TSH; Future  2. History of gout  - allopurinol (ZYLOPRIM) 300 MG tablet; TAKE 1 TABLET(300 MG) BY MOUTH DAILY  Dispense: 90 tablet; Refill: 3  3. Prostate cancer screening  - PSA; Future  4. Other obesity - Needs significant weight loss through diet and exercise - CBC with Differential/Platelet; Future - Comprehensive metabolic panel; Future - Lipid panel; Future - TSH; Future   [name], NP

## 2020-07-09 NOTE — Telephone Encounter (Signed)
Steven Peters at [employer] [facility] [employer] called with "Critical High"/HGB  18.8 at 10:57am/plz advise/thx dmf

## 2020-07-09 NOTE — Patient Instructions (Signed)
It was great seeing you today   We will follow up with you regarding your blood work   Please work on weight loss over the next year   Let me know if you need anything

## 2020-07-09 NOTE — Addendum Note (Signed)
Addended by: Raj Janus T on: 07/09/2020 08:51 AM   Modules accepted: Orders

## 2020-07-10 ENCOUNTER — Other Ambulatory Visit: Payer: Self-pay | Admitting: Family Medicine

## 2020-07-10 DIAGNOSIS — D582 Other hemoglobinopathies: Secondary | ICD-10-CM

## 2020-07-10 DIAGNOSIS — Z20822 Contact with and (suspected) exposure to covid-19: Secondary | ICD-10-CM | POA: Diagnosis not present

## 2020-07-14 ENCOUNTER — Telehealth: Payer: Self-pay | Admitting: Hematology and Oncology

## 2020-07-14 NOTE — Telephone Encounter (Signed)
Received a new hem referral from [name], NP for elevated hgb. Pt has been cld and scheduled to see Dr. Lorenso Courier on 1/24 at 9am. Pt aware to arrive 20 minutes early.

## 2020-07-27 ENCOUNTER — Other Ambulatory Visit: Payer: Self-pay

## 2020-07-27 ENCOUNTER — Inpatient Hospital Stay (HOSPITAL_BASED_OUTPATIENT_CLINIC_OR_DEPARTMENT_OTHER): Payer: BC Managed Care – PPO | Admitting: Hematology and Oncology

## 2020-07-27 ENCOUNTER — Inpatient Hospital Stay: Payer: BC Managed Care – PPO | Attending: Hematology and Oncology

## 2020-07-27 ENCOUNTER — Encounter: Payer: Self-pay | Admitting: Hematology and Oncology

## 2020-07-27 VITALS — BP 133/96 | HR 83 | Temp 97.3°F | Resp 20 | Ht 72.0 in | Wt 303.0 lb

## 2020-07-27 DIAGNOSIS — I10 Essential (primary) hypertension: Secondary | ICD-10-CM

## 2020-07-27 DIAGNOSIS — Z87891 Personal history of nicotine dependence: Secondary | ICD-10-CM

## 2020-07-27 DIAGNOSIS — E669 Obesity, unspecified: Secondary | ICD-10-CM | POA: Diagnosis not present

## 2020-07-27 DIAGNOSIS — D751 Secondary polycythemia: Secondary | ICD-10-CM | POA: Insufficient documentation

## 2020-07-27 DIAGNOSIS — E6609 Other obesity due to excess calories: Secondary | ICD-10-CM | POA: Diagnosis not present

## 2020-07-27 LAB — CMP (CANCER CENTER ONLY)
ALT: 23 U/L (ref 0–44)
AST: 19 U/L (ref 15–41)
Albumin: 4.1 g/dL (ref 3.5–5.0)
Alkaline Phosphatase: 54 U/L (ref 38–126)
Anion gap: 8 (ref 5–15)
BUN: 13 mg/dL (ref 6–20)
CO2: 25 mmol/L (ref 22–32)
Calcium: 9.3 mg/dL (ref 8.9–10.3)
Chloride: 107 mmol/L (ref 98–111)
Creatinine: 1.19 mg/dL (ref 0.61–1.24)
GFR, Estimated: >60 mL/min (ref >60)
Glucose, Bld: 95 mg/dL (ref 70–99)
Potassium: 4.7 mmol/L (ref 3.5–5.1)
Sodium: 140 mmol/L (ref 135–145)
Total Bilirubin: 0.9 mg/dL (ref 0.3–1.2)
Total Protein: 7.5 g/dL (ref 6.5–8.1)

## 2020-07-27 LAB — RETIC PANEL
Immature Retic Fract: 8.9 % (ref 2.3–15.9)
RBC.: 5.7 MIL/uL (ref 4.22–5.81)
Retic Count, Absolute: 89.5 10*3/uL (ref 19.0–186.0)
Retic Ct Pct: 1.6 % (ref 0.4–3.1)
Reticulocyte Hemoglobin: 36.7 pg (ref >27.9)

## 2020-07-27 LAB — CBC WITH DIFFERENTIAL (CANCER CENTER ONLY)
Abs Immature Granulocytes: 0.02 10*3/uL (ref 0.00–0.07)
Basophils Absolute: 0.1 10*3/uL (ref 0.0–0.1)
Basophils Relative: 1 %
Eosinophils Absolute: 0.4 10*3/uL (ref 0.0–0.5)
Eosinophils Relative: 6 %
HCT: 50.8 % (ref 39.0–52.0)
Hemoglobin: 17.7 g/dL — ABNORMAL HIGH (ref 13.0–17.0)
Immature Granulocytes: 0 %
Lymphocytes Relative: 29 %
Lymphs Abs: 1.7 10*3/uL (ref 0.7–4.0)
MCH: 31.4 pg (ref 26.0–34.0)
MCHC: 34.8 g/dL (ref 30.0–36.0)
MCV: 90.1 fL (ref 80.0–100.0)
Monocytes Absolute: 0.4 10*3/uL (ref 0.1–1.0)
Monocytes Relative: 7 %
Neutro Abs: 3.5 10*3/uL (ref 1.7–7.7)
Neutrophils Relative %: 57 %
Platelet Count: 250 10*3/uL (ref 150–400)
RBC: 5.64 MIL/uL (ref 4.22–5.81)
RDW: 13.4 % (ref 11.5–15.5)
WBC Count: 6.1 10*3/uL (ref 4.0–10.5)
nRBC: 0 % (ref 0.0–0.2)

## 2020-07-27 LAB — IRON AND TIBC
Iron: 151 ug/dL (ref 42–163)
Saturation Ratios: 47 % (ref 20–55)
TIBC: 318 ug/dL (ref 202–409)
UIBC: 167 ug/dL (ref 117–376)

## 2020-07-27 LAB — FERRITIN: Ferritin: 351 ng/mL — ABNORMAL HIGH (ref 24–336)

## 2020-07-27 NOTE — Progress Notes (Signed)
[facility] [phone]   Fax:([phone]) [place] NOTE  Patient Care Team: [name], NP as PCP - General (Family Medicine)  Hematological/Oncological History # Polycythemia, likely Secondary  01/10/2011: WBC 6.6, Hgb 16.8, MCV 93.5, Plt 287 (first on record) 08/18/2014: WBC 6.8, Hgb 17.9, MCV 92.3, Plt 357 03/29/2018: WBC 5.8, Hgb 17.7, MCV 92.3, Plt 290 08/15/2019: WBC 2.8, Hgb 17.2, MCV 89.1, Plt 174 07/09/2020: WBC 6.4, Hgb 18.8, MCV 92.2, Plt 291, Hct 54.4 07/27/2020: establish care with Dr. Lorenso Courier   CHIEF COMPLAINTS/PURPOSE OF CONSULTATION:  "Polycythemia "  HISTORY OF PRESENTING ILLNESS:  Steven Peters 55 y.o. male with medical history significant for obesity, gout, HTN, and osteoarthritis who presents for evaluation of polycythemia.   On review of the previous records Mr. Space has a longstanding history of polycythemia dating back to at least 08/18/2014 at which time he had a hemoglobin of 17.9.  He has maintained a hemoglobin greater than 17 since at least 2019.  Most recently on 07/09/2020 he was found to have a hemoglobin of 18.8, hematocrit of 54.4, with a normal MCV.  Of note he has had a normal MCV, normal white blood cell count, and normal platelet count throughout this entire time.  Given concern for the patient's elevated hemoglobin he was referred to hematology for further evaluation and management.  On exam today Steven Peters notes that he is here because he has "high red blood cells".  He notes that he is just recently been made aware of this condition.  He reports that he does have trouble with sleeping and he wakes up frequently throughout the night.  He notes that his wife does complain about him snoring.  When he does wake up he has trouble getting back to sleep.  He does not have any difficulty with daytime somnolence or waking up short of breath.  He has never had a sleep study.  On further discussion he does note that he has  some itching through out the day on his back.  He does have a history of gout which is currently under good control with allopurinol therapy.  He has not had a flare in 5 to 6 years.  He is a former smoker and quit approximately 10 to 12 years ago was only a casual smoker at the bar.  He does drink about 2-3 drinks per night with his drink of choice being vodka.  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  He is adopted and therefore has no family history.  He has 3 children who are otherwise healthy.  Today he denies any fevers, chills, sweats, nausea vomiting or diarrhea.  A full 10 point ROS is listed below.  MEDICAL HISTORY:  Past Medical History:  Diagnosis Date  . Allergy   . Cough   . Gout   . History of gout 08/26/2014  . Hypertension   . Left hip pain 06/15/2011  . Obesity   . SOB (shortness of breath)   . Unilateral primary osteoarthritis, left knee 04/25/2018    SURGICAL HISTORY: Past Surgical History:  Procedure Laterality Date  . KNEE ARTHROSCOPY     right knee  . WISDOM TOOTH EXTRACTION      SOCIAL HISTORY: Social History   Socioeconomic History  . Marital status: Married    Spouse name: Not on file  . Number of children: Not on file  . Years of education: Not on file  . Highest education level:  Not on file  Occupational History  . Not on file  Tobacco Use  . Smoking status: Former Research scientist (life sciences)  . Smokeless tobacco: Never Used  Substance and Sexual Activity  . Alcohol use: Yes    Alcohol/week: 2.0 standard drinks    Types: 2 Shots of liquor per week    Comment: weekends only  . Drug use: No  . Sexual activity: Not on file  Other Topics Concern  . Not on file  Social History Narrative  . Not on file   Social Determinants of Health   Financial Resource Strain: Not on file  Food Insecurity: Not on file  Transportation Needs: Not on file  Physical Activity: Not on file  Stress: Not on file  Social Connections: Not on file  Intimate  Partner Violence: Not on file    FAMILY HISTORY: Family History  Adopted: Yes  Problem Relation Age of Onset  . Colon cancer Neg Hx     ALLERGIES:  has No Known Allergies.  MEDICATIONS:  Current Outpatient Medications  Medication Sig Dispense Refill  . allopurinol (ZYLOPRIM) 300 MG tablet TAKE 1 TABLET(300 MG) BY MOUTH DAILY 90 tablet 3   No current facility-administered medications for this visit.    REVIEW OF SYSTEMS:   Constitutional: ( - ) fevers, ( - )  chills , ( - ) night sweats Eyes: ( - ) blurriness of vision, ( - ) double vision, ( - ) watery eyes Ears, nose, mouth, throat, and face: ( - ) mucositis, ( - ) sore throat Respiratory: ( - ) cough, ( - ) dyspnea, ( - ) wheezes Cardiovascular: ( - ) palpitation, ( - ) chest discomfort, ( - ) lower extremity swelling Gastrointestinal:  ( - ) nausea, ( - ) heartburn, ( - ) change in bowel habits Skin: ( - ) abnormal skin rashes Lymphatics: ( - ) new lymphadenopathy, ( - ) easy bruising Neurological: ( - ) numbness, ( - ) tingling, ( - ) new weaknesses Behavioral/Psych: ( - ) mood change, ( - ) new changes  All other systems were reviewed with the patient and are negative.  PHYSICAL EXAMINATION:  Vitals:   07/27/20 0900  BP: (!) 133/96  Pulse: 83  Resp: 20  Temp: (!) 97.3 F (36.3 C)  SpO2: 99%   Filed Weights   07/27/20 0900  Weight: (!) 303 lb (137.4 kg)    GENERAL: well appearing obese middle aged Caucasian male in NAD  SKIN: skin color, texture, turgor are normal, no rashes or significant lesions EYES: conjunctiva are pink and non-injected, sclera clear LUNGS: clear to auscultation and percussion with normal breathing effort HEART: regular rate & rhythm and no murmurs and no lower extremity edema Musculoskeletal: no cyanosis of digits and no clubbing  PSYCH: alert & oriented x 3, fluent speech NEURO: no focal motor/sensory deficits  LABORATORY DATA:  I have reviewed the data as listed CBC Latest Ref  Rng & Units 07/09/2020 08/15/2019 06/13/2019  WBC 4.0 - 10.5 K/uL 6.4 2.8(L) 6.2  Hemoglobin 13.0 - 17.0 g/dL 18.8 Repeated and verified X2.([place]) 17.2(H) 17.4(H)  Hematocrit 39.0 - 52.0 % 54.4 Repeated and verified X2.(H) 50.5 51.8  Platelets 150.0 - 400.0 K/uL 291.0 174 298.0    CMP Latest Ref Rng & Units 07/09/2020 08/15/2019 06/13/2019  Glucose 70 - 99 mg/dL 95 115(H) 90  BUN 6 - 23 mg/dL 16 12 15   Creatinine 0.40 - 1.50 mg/dL 1.24 1.09 1.15  Sodium 135 - 145 mEq/L 139  137 139  Potassium 3.5 - 5.1 mEq/L 4.8 3.8 4.5  Chloride 96 - 112 mEq/L 104 107 104  CO2 19 - 32 mEq/L 26 22 26   Calcium 8.4 - 10.5 mg/dL 9.7 8.0(L) 9.4  Total Protein 6.0 - 8.3 g/dL 7.8 7.0 7.2  Total Bilirubin 0.2 - 1.2 mg/dL 0.8 0.8 0.8  Alkaline Phos 39 - 117 U/L 55 46 49  AST 0 - 37 U/L 31 36 19  ALT 0 - 53 U/L 39 40 28    RADIOGRAPHIC STUDIES: No results found.  ASSESSMENT & PLAN Harlene SaltsDavid Hallquist 55 y.o. male with medical history significant for obesity, gout, HTN, and osteoarthritis who presents for evaluation of polycythemia.  I reviewed the labs, the records, schedule the patient the findings most consistent with a secondary polycythemia.  I do believe his polycythemia is most likely due to obstructive sleep apnea.  He does have the symptoms consistent with this disorder.  However will be thorough and try to rule out alternative etiologies including myeloproliferative neoplasm.  We will order a JAK2 panel as well as a BCR/ABL fish in order to rule out MPN.  Assuming his studies show no evidence of a myeloproliferative neoplasm we will follow through with a sleep study.  Assuming the etiology is obstructive sleep apnea the treatment of choice would be CPAP and managed at the discretion of sleep medicine.  He would not require baby aspirin in the event this was a secondary polycythemia.  We will plan to see the patient back if his findings are not consistent with OSA.  # Polycythemia, likely Secondary   --findings are  most consistent with a secondary polycythemia.  --in order to r/o an MPN will order JAK2 panel and BCR/ABL FISH --will order erythropoietin, iron panel, ferritin --baseline CMP and CBC --will recommend a sleep study to help r/o OSA. If above labs are consistent with an MPN we will cancel this. --encourage him to continue attempting weight loss --in the interim recommend the patient take 81mg  ASA daily   --RTC pending the results of the above studies.   Orders Placed This Encounter  Procedures  . CBC with Differential (Cancer Center Only)    Standing Status:   Future    Number of Occurrences:   1    Standing Expiration Date:   07/27/2021  . Retic Panel    Standing Status:   Future    Number of Occurrences:   1    Standing Expiration Date:   07/27/2021  . CMP (Cancer Center only)    Standing Status:   Future    Number of Occurrences:   1    Standing Expiration Date:   07/27/2021  . JAK2 (INCLUDING V617F AND EXON 12), MPL,& CALR W/RFL MPN PANEL ([facility])    Standing Status:   Future    Number of Occurrences:   1    Standing Expiration Date:   07/27/2021  . BCR ABL1 FISH ([facility])    Standing Status:   Future    Number of Occurrences:   1    Standing Expiration Date:   07/27/2021  . Iron and TIBC    Standing Status:   Future    Number of Occurrences:   1    Standing Expiration Date:   07/27/2021  . Ferritin    Standing Status:   Future    Number of Occurrences:   1    Standing Expiration Date:   07/27/2021  . Erythropoietin    Standing  Status:   Future    Number of Occurrences:   1    Standing Expiration Date:   07/27/2021    All questions were answered. The patient knows to call the clinic with any problems, questions or concerns.  A total of more than 60 minutes were spent on this encounter and over half of that time was spent on counseling and coordination of care as outlined above.   Ledell Peoples, MD Department of Hematology/Oncology [place] at [facility] Phone: [phone] Pager: [phone] Email: [email or web address]  07/27/2020 9:43 AM

## 2020-07-28 LAB — ERYTHROPOIETIN: Erythropoietin: 5.6 m[IU]/mL (ref 2.6–18.5)

## 2020-07-29 ENCOUNTER — Other Ambulatory Visit: Payer: Self-pay | Admitting: Hematology and Oncology

## 2020-07-29 DIAGNOSIS — D751 Secondary polycythemia: Secondary | ICD-10-CM

## 2020-07-29 NOTE — Progress Notes (Signed)
a 

## 2020-08-03 LAB — JAK2 (INCLUDING V617F AND EXON 12), MPL,& CALR W/RFL MPN PANEL (NGS)

## 2020-08-04 LAB — BCR ABL1 FISH (GENPATH)

## 2020-08-05 ENCOUNTER — Telehealth: Payer: Self-pay | Admitting: *Deleted

## 2020-08-05 DIAGNOSIS — D751 Secondary polycythemia: Secondary | ICD-10-CM

## 2020-08-05 NOTE — Telephone Encounter (Signed)
-----   Message from Orson Slick, MD sent at 08/05/2020  9:14 AM EST ----- Please let Mr. Dresch know that his mutational panel has returned and shown no evidence of a bone marrow disorder. His high Hgb is most likely 2/2 to uncontrolled obstructive sleep apnea. A referral has been placed to sleep medicine, please assure this is scheduled for him. Additionally we will plan to see him back in 6 months time.    ----- Message ----- From: Buel Ream, [facility] [place] Sent: 07/27/2020  10:00 AM EST To: Orson Slick, MD

## 2020-08-05 NOTE — Telephone Encounter (Signed)
TCT patient regarding lab results last week. No answer but was able to leave vm message for pt to return call at his convenience. Call made to [facility].  They advised that sleep studies are scheduled from an 'order' not a referral. Order placed for sleep study. They could now see that order and will contact patient after they verify his insurance.

## 2020-08-06 ENCOUNTER — Telehealth: Payer: Self-pay | Admitting: Hematology and Oncology

## 2020-08-06 NOTE — Telephone Encounter (Signed)
Scheduled appt per 2/2 sch msg - left message for patient with appt date and time

## 2020-09-22 ENCOUNTER — Ambulatory Visit: Payer: Self-pay

## 2020-09-22 ENCOUNTER — Ambulatory Visit (INDEPENDENT_AMBULATORY_CARE_PROVIDER_SITE_OTHER): Payer: BC Managed Care – PPO | Admitting: Orthopaedic Surgery

## 2020-09-22 VITALS — Ht 72.0 in | Wt 290.0 lb

## 2020-09-22 DIAGNOSIS — M25562 Pain in left knee: Secondary | ICD-10-CM

## 2020-09-22 DIAGNOSIS — G8929 Other chronic pain: Secondary | ICD-10-CM

## 2020-09-22 DIAGNOSIS — M25561 Pain in right knee: Secondary | ICD-10-CM

## 2020-09-22 MED ORDER — LIDOCAINE HCL 1 % IJ SOLN
3.0000 mL | INTRAMUSCULAR | Status: AC | PRN
  Administered 2020-09-22: 3 mL

## 2020-09-22 MED ORDER — METHYLPREDNISOLONE ACETATE 40 MG/ML IJ SUSP
40.0000 mg | INTRAMUSCULAR | Status: AC | PRN
  Administered 2020-09-22: 40 mg via INTRA_ARTICULAR

## 2020-09-22 NOTE — Progress Notes (Signed)
Office Visit Note   Patient: Steven Peters           Date of Birth: 03-Nov-1965           MRN: [id number] Visit Date: 09/22/2020              Requested by: [name], NP [address] [place],  [place] [address] PCP: [name], NP   Assessment & Plan: Visit Diagnoses:  1. Chronic pain of left knee   2. Chronic pain of right knee     Plan: I was able to aspirate 30 cc of fluid from both knees consistent with osteoarthritis.  I then placed a steroid injection of both knees.  At this point he is a candidate for hyaluronic acid given the failure of all forms and modalities of conservative treatment including activity modification, weight loss, oral anti-inflammatories, quad strengthening exercises, knee aspirations and steroid injections.  This would be the next step to treat his knees with ordering hyaluronic acid for them.  Follow-Up Instructions: No follow-ups on file.   Orders:  Orders Placed This Encounter  Procedures  . Large Joint Inj  . Large Joint Inj  . XR Knee 1-2 Views Left  . XR Knee 1-2 Views Right   No orders of the defined types were placed in this encounter.     Procedures: Large Joint Inj: R knee on 09/22/2020 9:12 AM Indications: diagnostic evaluation and pain Details: 22 G 1.5 in needle, superolateral approach  Arthrogram: No  Medications: 3 mL lidocaine 1 %; 40 mg methylPREDNISolone acetate 40 MG/ML Outcome: tolerated well, no immediate complications Procedure, treatment alternatives, risks and benefits explained, specific risks discussed. Consent was given by the patient. Immediately prior to procedure a time out was called to verify the correct patient, procedure, equipment, support staff and site/side marked as required. Patient was prepped and draped in the usual sterile fashion.   Large Joint Inj: L knee on 09/22/2020 9:12 AM Indications: diagnostic evaluation and pain Details: 22 G 1.5 in needle, superolateral  approach  Arthrogram: No  Medications: 3 mL lidocaine 1 %; 40 mg methylPREDNISolone acetate 40 MG/ML Outcome: tolerated well, no immediate complications Procedure, treatment alternatives, risks and benefits explained, specific risks discussed. Consent was given by the patient. Immediately prior to procedure a time out was called to verify the correct patient, procedure, equipment, support staff and site/side marked as required. Patient was prepped and draped in the usual sterile fashion.       Clinical Data: No additional findings.   Subjective: Chief Complaint  Patient presents with  . Left Knee - Pain  [name] comes in again once again for try to get approval for hyaluronic acid knowing that he has moderate arthritis in his knees and this was his his knees.  Have seen him for both knees over the years.  He does get intermittent swelling when he is been on his knees quite a bit.  We have placed steroid injections in his knees before.  At one point we did try to get approval for hyaluronic acid for his knees but this was denied by his insurance company.  Since we saw him last, he has had intermittent knee pain and swelling.  He is work on his knees quite a bit with lots of standing and walking.  He has tried activity modification, weight loss, anti-inflammatories orally, and quad strengthening exercises as well as a home exercise program to strengthen his knees.  Again we apply steroid injections in  his knees before.  He has never had surgery on these knees.  HPI  Review of Systems He currently denies any headache, chest pain, shortness of breath, fever, chills, nausea, vomiting  Objective: Vital Signs: Ht 6' (1.829 m)   Wt 290 lb (131.5 kg)   BMI 39.33 kg/m   Physical Exam He is alert and orient x3 and in no acute distress Ortho Exam Examination of both knees shows moderate effusion.  There is no redness.  There is a painful arc of motion of both knees.  Both knees are  ligamentously stable but have significant patellofemoral crepitation. Specialty Comments:  No specialty comments available.  Imaging: XR Knee 1-2 Views Left  Result Date: 09/22/2020 2 views of the left knee show moderate tricompartmental arthritis with lateral joint space narrowing and para-articular osteophytes in all 3 compartments.  XR Knee 1-2 Views Right  Result Date: 09/22/2020 2 views of the right knee show moderate arthritic changes with significant lateral joint space narrowing and para-articular osteophytes in all 3 compartments.    PMFS History: Patient Active Problem List   Diagnosis Date Noted  . Unilateral primary osteoarthritis, left knee 04/25/2018  . History of gout 08/26/2014  . Routine general medical examination at a health care facility 08/26/2014  . Left hip pain 06/15/2011   Past Medical History:  Diagnosis Date  . Allergy   . Cough   . Gout   . History of gout 08/26/2014  . Hypertension   . Left hip pain 06/15/2011  . Obesity   . SOB (shortness of breath)   . Unilateral primary osteoarthritis, left knee 04/25/2018    Family History  Adopted: Yes  Problem Relation Age of Onset  . Colon cancer Neg Hx     Past Surgical History:  Procedure Laterality Date  . KNEE ARTHROSCOPY     right knee  . WISDOM TOOTH EXTRACTION     Social History   Occupational History  . Not on file  Tobacco Use  . Smoking status: Former Research scientist (life sciences)  . Smokeless tobacco: Never Used  Substance and Sexual Activity  . Alcohol use: Yes    Alcohol/week: 2.0 standard drinks    Types: 2 Shots of liquor per week    Comment: weekends only  . Drug use: No  . Sexual activity: Not on file

## 2020-09-23 ENCOUNTER — Telehealth: Payer: Self-pay

## 2020-09-23 NOTE — Telephone Encounter (Signed)
I think this is the one I asked you about his insurance and them not paying for gel injections Can you give him the information on the injections he can pay for ?trivisc?

## 2020-09-23 NOTE — Telephone Encounter (Signed)
Bilateral knee gel injections 

## 2020-09-23 NOTE — Telephone Encounter (Signed)
Noted  

## 2020-09-24 NOTE — Telephone Encounter (Signed)
Talked with patient concerning gel injections. 

## 2020-10-26 ENCOUNTER — Telehealth: Payer: Self-pay

## 2020-10-26 NOTE — Telephone Encounter (Signed)
Called and left a VM advising patient that his insurance [employer], does not cover gel injections and if he would like to try TriVisc, which he pays for OOP, to give me a call back to proceed with the process.

## 2021-01-06 ENCOUNTER — Encounter: Payer: Self-pay | Admitting: Orthopaedic Surgery

## 2021-01-06 ENCOUNTER — Ambulatory Visit (INDEPENDENT_AMBULATORY_CARE_PROVIDER_SITE_OTHER): Payer: BC Managed Care – PPO | Admitting: Orthopaedic Surgery

## 2021-01-06 DIAGNOSIS — M1712 Unilateral primary osteoarthritis, left knee: Secondary | ICD-10-CM | POA: Diagnosis not present

## 2021-01-06 DIAGNOSIS — M1711 Unilateral primary osteoarthritis, right knee: Secondary | ICD-10-CM

## 2021-01-06 MED ORDER — LIDOCAINE HCL 1 % IJ SOLN
3.0000 mL | INTRAMUSCULAR | Status: AC | PRN
  Administered 2021-01-06: 3 mL

## 2021-01-06 MED ORDER — METHYLPREDNISOLONE ACETATE 40 MG/ML IJ SUSP
40.0000 mg | INTRAMUSCULAR | Status: AC | PRN
  Administered 2021-01-06: 40 mg via INTRA_ARTICULAR

## 2021-01-06 NOTE — Progress Notes (Signed)
   Procedure Note  Patient: Steven Peters             Date of Birth: March 02, 1966           MRN: [id number]             Visit Date: 01/06/2021 HPI: Mr. Trickey returns today wanting both knees aspirated.  He has known tricompartmental arthritis both knees.  He has had no injury to either knee.  States knee started bothering him after the aspiration injections into March sometime around May or June.  He feels both knees are tight.  Review of systems see HPI otherwise negative or noncontributory.  Bilateral knees: Good range of motion.  No abnormal warmth erythema.  Slight effusion right knee only.  Procedures: Visit Diagnoses:  1. Unilateral primary osteoarthritis, left knee   2. Primary osteoarthritis of right knee     Large Joint Inj: bilateral knee on 01/06/2021 8:54 AM Indications: pain Details: 22 G 1.5 in needle, superolateral approach  Arthrogram: No  Medications (Right): 3 mL lidocaine 1 %; 40 mg methylPREDNISolone acetate 40 MG/ML Aspirate (Right): 15 mL yellow Medications (Left): 3 mL lidocaine 1 %; 40 mg methylPREDNISolone acetate 40 MG/ML Outcome: tolerated well, no immediate complications Procedure, treatment alternatives, risks and benefits explained, specific risks discussed. Consent was given by the patient. Immediately prior to procedure a time out was called to verify the correct patient, procedure, equipment, support staff and site/side marked as required. Patient was prepped and draped in the usual sterile fashion.     Plan: We will see him back in 3 months to see how he is doing overall may consider repeat injections.  At some point time based on patient's insurance may consider supplemental injection of the knees.  Questions were encouraged and answered.

## 2021-02-03 ENCOUNTER — Other Ambulatory Visit: Payer: BC Managed Care – PPO

## 2021-02-03 ENCOUNTER — Ambulatory Visit: Payer: BC Managed Care – PPO | Admitting: Hematology and Oncology

## 2021-04-05 ENCOUNTER — Ambulatory Visit: Payer: BC Managed Care – PPO | Admitting: Orthopaedic Surgery

## 2021-05-10 ENCOUNTER — Ambulatory Visit: Payer: BC Managed Care – PPO | Admitting: Physician Assistant

## 2021-05-17 ENCOUNTER — Encounter: Payer: Self-pay | Admitting: Orthopaedic Surgery

## 2021-05-17 ENCOUNTER — Telehealth: Payer: Self-pay

## 2021-05-17 ENCOUNTER — Ambulatory Visit (INDEPENDENT_AMBULATORY_CARE_PROVIDER_SITE_OTHER): Payer: BC Managed Care – PPO | Admitting: Orthopaedic Surgery

## 2021-05-17 DIAGNOSIS — M1711 Unilateral primary osteoarthritis, right knee: Secondary | ICD-10-CM | POA: Diagnosis not present

## 2021-05-17 DIAGNOSIS — M25562 Pain in left knee: Secondary | ICD-10-CM | POA: Diagnosis not present

## 2021-05-17 DIAGNOSIS — G8929 Other chronic pain: Secondary | ICD-10-CM

## 2021-05-17 DIAGNOSIS — M25561 Pain in right knee: Secondary | ICD-10-CM | POA: Diagnosis not present

## 2021-05-17 DIAGNOSIS — M1712 Unilateral primary osteoarthritis, left knee: Secondary | ICD-10-CM

## 2021-05-17 NOTE — Telephone Encounter (Signed)
Bilateral knee gel injections 

## 2021-05-17 NOTE — Telephone Encounter (Signed)
Duplicate

## 2021-05-17 NOTE — Telephone Encounter (Signed)
Please submit for bil knee gel injs-[facility]

## 2021-05-17 NOTE — Telephone Encounter (Signed)
Noted  

## 2021-05-17 NOTE — Progress Notes (Signed)
Office Visit Note   Patient: Steven Peters           Date of Birth: 03/12/1966           MRN: [id number] Visit Date: 05/17/2021              Requested by: [name], NP [place],  [place] [address] PCP: [name], NP   Assessment & Plan: Visit Diagnoses:  1. Chronic pain of left knee   2. Chronic pain of right knee   3. Primary osteoarthritis of right knee   4. Unilateral primary osteoarthritis, left knee     Plan: Able to aspirate 25 cc of fluid from his left knee and place a steroid injection of left knee.  Given the failure of multiple steroid injections though, he is a perfect candidate for hyaluronic acid for both knees at this point.  We will see if we get this ordered for him and see him back in follow-up to place hyaluronic acid in both knees to treat the pain from osteoarthritis.  Follow-Up Instructions: No follow-ups on file.  We will be in touch for getting him back to the office once hyaluronic acid has been approved by his insurance company for both knees.  Orders:  No orders of the defined types were placed in this encounter.  No orders of the defined types were placed in this encounter.     Procedures: No procedures performed   Clinical Data: No additional findings.   Subjective: Chief Complaint  Patient presents with   Left Knee - Edema, Pain  The patient is very well-known to me.  He has well-documented osteoarthritis of both knees.  From time to time he has come in for steroid injections and aspirations of the knees.  Recently he has been walking a lot in his left knee his knee and swelling in both knees certainly hurt.  We have x-rayed both knees before showing moderate arthritis in both knees.  He is appropriately inquiring about hyaluronic acid at this point given failure of all forms of conservative treatment and his continued pain as relates to activities with the osteoarthritis of both knees.  He is worked on activity  modification and weight loss as well as taking anti-inflammatories and this is been going on for many years now.  He has never had hyaluronic acid in his knees but he has had steroid injections.  He has had no acute changes in medical status.  HPI  Review of Systems He is alert and orient x3 and in no acute distress  Objective: Vital Signs: There were no vitals taken for this visit.  Physical Exam There is currently listed no headache, chest pain, shortness of breath, fever, chills, nausea, vomiting Ortho Exam Examination of the left knee shows some slight warmth and slight fullness with patellofemoral crepitation throughout the arc of motion.  It is ligamentously stable but medial lateral joint line tenderness.  The same is with the right knee but there is no effusion of the right knee. Specialty Comments:  No specialty comments available.  Imaging: No results found.   PMFS History: Patient Active Problem List   Diagnosis Date Noted   Unilateral primary osteoarthritis, left knee 04/25/2018   History of gout 08/26/2014   Routine general medical examination at a health care facility 08/26/2014   Past Medical History:  Diagnosis Date   Allergy    Cough    Gout    History of gout 08/26/2014  Hypertension    Left hip pain 06/15/2011   Obesity    SOB (shortness of breath)    Unilateral primary osteoarthritis, left knee 04/25/2018    Family History  Adopted: Yes  Problem Relation Age of Onset   Colon cancer Neg Hx     Past Surgical History:  Procedure Laterality Date   KNEE ARTHROSCOPY     right knee   WISDOM TOOTH EXTRACTION     Social History   Occupational History   Not on file  Tobacco Use   Smoking status: Former   Smokeless tobacco: Never  Substance and Sexual Activity   Alcohol use: Yes    Alcohol/week: 2.0 standard drinks    Types: 2 Shots of liquor per week    Comment: weekends only   Drug use: No   Sexual activity: Not on file

## 2021-07-21 ENCOUNTER — Other Ambulatory Visit: Payer: Self-pay | Admitting: Adult Health

## 2021-07-21 DIAGNOSIS — Z8739 Personal history of other diseases of the musculoskeletal system and connective tissue: Secondary | ICD-10-CM

## 2021-07-21 NOTE — Telephone Encounter (Signed)
Patient need to schedule a CPE for more refills. 

## 2021-07-23 ENCOUNTER — Other Ambulatory Visit: Payer: Self-pay

## 2021-07-23 ENCOUNTER — Telehealth: Payer: Self-pay | Admitting: Adult Health

## 2021-07-23 DIAGNOSIS — Z8739 Personal history of other diseases of the musculoskeletal system and connective tissue: Secondary | ICD-10-CM

## 2021-07-23 NOTE — Telephone Encounter (Signed)
Pt call and made a appt for 08/27/21 and need a refill on allopurinol (ZYLOPRIM) 300 MG tablet  sent to  [facility] [place], [place] [address] Phone:  [phone]  Fax:  [phone]    Need some sent until his appt.

## 2021-07-23 NOTE — Telephone Encounter (Signed)
Last Ov 07/09/20 Upcoming appt 2/24 Is it ok to refill?

## 2021-07-30 ENCOUNTER — Ambulatory Visit: Payer: BC Managed Care – PPO | Admitting: Adult Health

## 2021-08-01 ENCOUNTER — Encounter: Payer: Self-pay | Admitting: Adult Health

## 2021-08-01 DIAGNOSIS — Z8739 Personal history of other diseases of the musculoskeletal system and connective tissue: Secondary | ICD-10-CM

## 2021-08-02 MED ORDER — ALLOPURINOL 300 MG PO TABS: ORAL_TABLET | ORAL | 0 refills | Status: DC

## 2021-08-27 ENCOUNTER — Encounter: Payer: Self-pay | Admitting: Adult Health

## 2021-08-27 ENCOUNTER — Ambulatory Visit (INDEPENDENT_AMBULATORY_CARE_PROVIDER_SITE_OTHER): Payer: 59 | Admitting: Adult Health

## 2021-08-27 VITALS — BP 118/88 | HR 99 | Temp 97.9°F | Ht 72.0 in | Wt 294.0 lb

## 2021-08-27 DIAGNOSIS — Z8739 Personal history of other diseases of the musculoskeletal system and connective tissue: Secondary | ICD-10-CM

## 2021-08-27 DIAGNOSIS — E6689 Other obesity not elsewhere classified: Secondary | ICD-10-CM

## 2021-08-27 DIAGNOSIS — Z1159 Encounter for screening for other viral diseases: Secondary | ICD-10-CM

## 2021-08-27 DIAGNOSIS — M1712 Unilateral primary osteoarthritis, left knee: Secondary | ICD-10-CM | POA: Diagnosis not present

## 2021-08-27 DIAGNOSIS — G4730 Sleep apnea, unspecified: Secondary | ICD-10-CM | POA: Diagnosis not present

## 2021-08-27 DIAGNOSIS — Z0000 Encounter for general adult medical examination without abnormal findings: Secondary | ICD-10-CM

## 2021-08-27 DIAGNOSIS — Z125 Encounter for screening for malignant neoplasm of prostate: Secondary | ICD-10-CM | POA: Diagnosis not present

## 2021-08-27 DIAGNOSIS — E668 Other obesity: Secondary | ICD-10-CM | POA: Diagnosis not present

## 2021-08-27 DIAGNOSIS — D751 Secondary polycythemia: Secondary | ICD-10-CM

## 2021-08-27 LAB — CBC WITH DIFFERENTIAL/PLATELET
Basophils Absolute: 0 10*3/uL (ref 0.0–0.1)
Basophils Relative: 0.4 % (ref 0.0–3.0)
Eosinophils Absolute: 0.3 10*3/uL (ref 0.0–0.7)
Eosinophils Relative: 4.5 % (ref 0.0–5.0)
HCT: 53.1 % — ABNORMAL HIGH (ref 39.0–52.0)
Hemoglobin: 18.1 g/dL (ref 13.0–17.0)
Lymphocytes Relative: 28.8 % (ref 12.0–46.0)
Lymphs Abs: 2.1 10*3/uL (ref 0.7–4.0)
MCHC: 34 g/dL (ref 30.0–36.0)
MCV: 91.8 fl (ref 78.0–100.0)
Monocytes Absolute: 0.6 10*3/uL (ref 0.1–1.0)
Monocytes Relative: 8.6 % (ref 3.0–12.0)
Neutro Abs: 4.2 10*3/uL (ref 1.4–7.7)
Neutrophils Relative %: 57.7 % (ref 43.0–77.0)
Platelets: 300 10*3/uL (ref 150.0–400.0)
RBC: 5.79 Mil/uL (ref 4.22–5.81)
RDW: 13.8 % (ref 11.5–15.5)
WBC: 7.3 10*3/uL (ref 4.0–10.5)

## 2021-08-27 LAB — COMPREHENSIVE METABOLIC PANEL
ALT: 27 U/L (ref 0–53)
AST: 22 U/L (ref 0–37)
Albumin: 4.6 g/dL (ref 3.5–5.2)
Alkaline Phosphatase: 57 U/L (ref 39–117)
BUN: 15 mg/dL (ref 6–23)
CO2: 26 mEq/L (ref 19–32)
Calcium: 9.8 mg/dL (ref 8.4–10.5)
Chloride: 104 mEq/L (ref 96–112)
Creatinine, Ser: 1.13 mg/dL (ref 0.40–1.50)
GFR: 72.8 mL/min (ref >60.00)
Glucose, Bld: 96 mg/dL (ref 70–99)
Potassium: 4.8 mEq/L (ref 3.5–5.1)
Sodium: 139 mEq/L (ref 135–145)
Total Bilirubin: 1 mg/dL (ref 0.2–1.2)
Total Protein: 7.5 g/dL (ref 6.0–8.3)

## 2021-08-27 LAB — LIPID PANEL
Cholesterol: 185 mg/dL (ref 0–200)
HDL: 43.1 mg/dL (ref >39.00)
LDL Cholesterol: 123 mg/dL — ABNORMAL HIGH (ref 0–99)
NonHDL: 141.68
Total CHOL/HDL Ratio: 4
Triglycerides: 93 mg/dL (ref 0.0–149.0)
VLDL: 18.6 mg/dL (ref 0.0–40.0)

## 2021-08-27 LAB — TSH: TSH: 2.14 u[IU]/mL (ref 0.35–5.50)

## 2021-08-27 LAB — PSA: PSA: 0.44 ng/mL (ref 0.10–4.00)

## 2021-08-27 LAB — URIC ACID: Uric Acid, Serum: 5.4 mg/dL (ref 4.0–7.8)

## 2021-08-27 MED ORDER — ALLOPURINOL 300 MG PO TABS: ORAL_TABLET | ORAL | 3 refills | Status: DC

## 2021-08-27 NOTE — Patient Instructions (Addendum)
It was great seeing you today   We will follow up with you regarding your lab work   Please let me know if you need anything   [facility]  Address: [address], [place], [place] [address] Phone: [phone]  Someone will call you to schedule your sleep study

## 2021-08-27 NOTE — Progress Notes (Signed)
Subjective:    Patient ID: Steven Peters, male    DOB: February 14, 1966, 56 y.o.   MRN: [id number]  HPI Patient presents for yearly preventative medicine examination. He is a pleasant 56 year old male who  has a past medical history of Allergy, Cough, Gout, History of gout (08/26/2014), Hypertension, Left hip pain (06/15/2011), Obesity, SOB (shortness of breath), and Unilateral primary osteoarthritis, left knee (04/25/2018).  H/o Gout -takes allopurinol 300 mg daily.  He denies any recent gout flares  Obesity-  he has been working on diet. Does not exercise. He travels weekly for work  [employer] from Last 3 Encounters:  08/27/21 294 lb (133.4 kg)  09/22/20 290 lb (131.5 kg)  07/27/20 (!) 303 lb (137.4 kg)   Secondary Polycythemia -in the setting of sleep apnea.  Sleep Apnea - never had sleep study done   Osteoarthritis - Known tricompartmental arthritis of bilateral knees.  Is followed by orthopedics for intermittent knee aspirations.  Hyperlipidemia -mildly elevated LDL cholesterol in the past.  Is not on any medication at this time Lab Results  Component Value Date   CHOL 201 (H) 07/09/2020   HDL 40.60 07/09/2020   LDLCALC 122 (H) 07/09/2020   TRIG 193.0 (H) 07/09/2020   CHOLHDL 5 07/09/2020    All immunizations and health maintenance protocols were reviewed with the patient and needed orders were placed.  Appropriate screening laboratory values were ordered for the patient including screening of hyperlipidemia, renal function and hepatic function. If indicated by BPH, a PSA was ordered.  Medication reconciliation,  past medical history, social history, problem list and allergies were reviewed in detail with the patient  Goals were established with regard to weight loss, exercise, and  diet in compliance with medications  Is overdue for his colonoscopy.  Is on recall list  He has no acute complaints   Review of Systems  Constitutional: Negative.   HENT: Negative.     Eyes: Negative.   Respiratory: Negative.    Cardiovascular: Negative.   Gastrointestinal: Negative.   Endocrine: Negative.   Genitourinary: Negative.   Musculoskeletal:  Positive for arthralgias.  Skin: Negative.   Allergic/Immunologic: Negative.   Neurological: Negative.   Hematological: Negative.   Psychiatric/Behavioral: Negative.    All other systems reviewed and are negative.  Past Medical History:  Diagnosis Date   Allergy    Cough    Gout    History of gout 08/26/2014   Hypertension    Left hip pain 06/15/2011   Obesity    SOB (shortness of breath)    Unilateral primary osteoarthritis, left knee 04/25/2018    Social History   Socioeconomic History   Marital status: Married    Spouse name: Not on file   Number of children: Not on file   Years of education: Not on file   Highest education level: Not on file  Occupational History   Not on file  Tobacco Use   Smoking status: Former   Smokeless tobacco: Never  Substance and Sexual Activity   Alcohol use: Yes    Alcohol/week: 2.0 standard drinks    Types: 2 Shots of liquor per week    Comment: weekends only   Drug use: No   Sexual activity: Not on file  Other Topics Concern   Not on file  Social History Narrative   Not on file   Social Determinants of Health   Financial Resource Strain: Not on file  Food Insecurity: Not on file  Transportation Needs:  Not on file  Physical Activity: Not on file  Stress: Not on file  Social Connections: Not on file  Intimate Partner Violence: Not on file    Past Surgical History:  Procedure Laterality Date   KNEE ARTHROSCOPY     right knee   WISDOM TOOTH EXTRACTION      Family History  Adopted: Yes  Problem Relation Age of Onset   Colon cancer Neg Hx     No Known Allergies  Current Outpatient Medications on File Prior to Visit  Medication Sig Dispense Refill   allopurinol (ZYLOPRIM) 300 MG tablet TAKE 1 TABLET(300 MG) BY MOUTH DAILY 30 tablet 0   No  current facility-administered medications on file prior to visit.    BP 118/88    Pulse 99    Temp 97.9 F (36.6 C) (Oral)    Ht 6' (1.829 m)    Wt 294 lb (133.4 kg)    SpO2 99%    BMI 39.87 kg/m        Objective:   Physical Exam Vitals and nursing note reviewed.  Constitutional:      General: He is not in acute distress.    Appearance: Normal appearance. He is well-developed. He is obese.  HENT:     Head: Normocephalic and atraumatic.     Right Ear: Tympanic membrane, ear canal and external ear normal. There is no impacted cerumen.     Left Ear: Tympanic membrane, ear canal and external ear normal. There is no impacted cerumen.     Nose: Nose normal. No congestion or rhinorrhea.     Mouth/Throat:     Mouth: Mucous membranes are moist.     Pharynx: Oropharynx is clear. No oropharyngeal exudate or posterior oropharyngeal erythema.  Eyes:     General:        Right eye: No discharge.        Left eye: No discharge.     Extraocular Movements: Extraocular movements intact.     Conjunctiva/sclera: Conjunctivae normal.     Pupils: Pupils are equal, round, and reactive to light.  Neck:     Vascular: No carotid bruit.     Trachea: No tracheal deviation.  Cardiovascular:     Rate and Rhythm: Normal rate and regular rhythm.     Pulses: Normal pulses.     Heart sounds: Normal heart sounds. No murmur heard.   No friction rub. No gallop.  Pulmonary:     Effort: Pulmonary effort is normal. No respiratory distress.     Breath sounds: Normal breath sounds. No stridor. No wheezing, rhonchi or rales.  Chest:     Chest wall: No tenderness.  Abdominal:     General: Bowel sounds are normal. There is no distension.     Palpations: Abdomen is soft. There is no mass.     Tenderness: There is no abdominal tenderness. There is no right CVA tenderness, left CVA tenderness, guarding or rebound.     Hernia: No hernia is present.  Musculoskeletal:        General: No swelling, tenderness, deformity  or signs of injury. Normal range of motion.     Right lower leg: No edema.     Left lower leg: No edema.  Lymphadenopathy:     Cervical: No cervical adenopathy.  Skin:    General: Skin is warm and dry.     Capillary Refill: Capillary refill takes less than 2 seconds.     Coloration: Skin is not jaundiced or pale.  Findings: No bruising, erythema, lesion or rash.  Neurological:     General: No focal deficit present.     Mental Status: He is alert and oriented to person, place, and time.     Cranial Nerves: No cranial nerve deficit.     Sensory: No sensory deficit.     Motor: No weakness.     Coordination: Coordination normal.     Gait: Gait normal.     Deep Tendon Reflexes: Reflexes normal.  Psychiatric:        Mood and Affect: Mood normal.        Behavior: Behavior normal.        Thought Content: Thought content normal.        Judgment: Judgment normal.       Assessment & Plan:   1. Routine general medical examination at a health care facility - Encouraged weight loss through diet and exercise  - Follow up in on year or sooner if needed - CBC with Differential/Platelet; Future - Comprehensive metabolic panel; Future - Lipid panel; Future - TSH; Future  2. Prostate cancer screening  - PSA; Future  3. Other obesity - Encouraged weight loss through diet and exercise  - Discussed Wegovy but patient is not interested at this time - CBC with Differential/Platelet; Future - Comprehensive metabolic panel; Future - Lipid panel; Future - TSH; Future - PSA; Future  4. History of gout  - Uric Acid; Future - allopurinol (ZYLOPRIM) 300 MG tablet; TAKE 1 TABLET(300 MG) BY MOUTH DAILY  Dispense: 90 tablet; Refill: 3  5. Unilateral primary osteoarthritis, left knee - Follow up with Orthopedics as directed  - CBC with Differential/Platelet; Future - Comprehensive metabolic panel; Future - Lipid panel; Future - TSH; Future - PSA; Future  6. Sleep apnea, unspecified  type - Encouraged weigh tloss  - CBC with Differential/Platelet; Future - Comprehensive metabolic panel; Future - Lipid panel; Future - TSH; Future - PSA; Future - Ambulatory referral to Pulmonology  7. Secondary polycythemia - Will order sleep study  - CBC with Differential/Platelet; Future - Comprehensive metabolic panel; Future - Lipid panel; Future - TSH; Future - PSA; Future  8. Need for hepatitis C screening test  - Hep C Antibody; Future  [name], NP

## 2021-08-30 LAB — HEPATITIS C ANTIBODY
Hepatitis C Ab: NONREACTIVE
SIGNAL TO CUT-OFF: 0.02 (ref <1.00)

## 2021-09-14 ENCOUNTER — Encounter: Payer: Self-pay | Admitting: Adult Health

## 2021-09-14 ENCOUNTER — Ambulatory Visit (INDEPENDENT_AMBULATORY_CARE_PROVIDER_SITE_OTHER): Payer: 59 | Admitting: Adult Health

## 2021-09-14 VITALS — BP 110/90 | HR 90 | Temp 98.6°F | Ht 72.0 in | Wt 295.0 lb

## 2021-09-14 DIAGNOSIS — S97102A Crushing injury of unspecified left toe(s), initial encounter: Secondary | ICD-10-CM

## 2021-09-14 NOTE — Progress Notes (Signed)
? ?Subjective:  ? ? Patient ID: Steven Peters, male    DOB: Aug 06, 1965, 56 y.o.   MRN: [id number] ? ?HPI ?56 year old male who  has a past medical history of Allergy, Cough, Gout, History of gout (08/26/2014), Hypertension, Left hip pain (06/15/2011), Obesity, SOB (shortness of breath), and Unilateral primary osteoarthritis, left knee (04/25/2018). ? ?He presents to the office today for follow up after being seen in the ER in [place] 4 days ago. He reports that he dropped a weight on his foot. He had his toenail removed on his left great foot and had 6 sutures placed. He has been on Augmentin TID. He has had some yellow bloody drainage from the wound and wants to make sure it not infected.  ? ?He is not concerned that anything is broken.  ? ?Has been wrapping in gauze and antibiotic gauze.  ? ? ?Review of Systems ?See HPI  ? ?Past Medical History:  ?Diagnosis Date  ? Allergy   ? Cough   ? Gout   ? History of gout 08/26/2014  ? Hypertension   ? Left hip pain 06/15/2011  ? Obesity   ? SOB (shortness of breath)   ? Unilateral primary osteoarthritis, left knee 04/25/2018  ? ? ?Social History  ? ?Socioeconomic History  ? Marital status: Married  ?  Spouse name: Not on file  ? Number of children: Not on file  ? Years of education: Not on file  ? Highest education level: Not on file  ?Occupational History  ? Not on file  ?Tobacco Use  ? Smoking status: Former  ? Smokeless tobacco: Never  ?Substance and Sexual Activity  ? Alcohol use: Yes  ?  Alcohol/week: 2.0 standard drinks  ?  Types: 2 Shots of liquor per week  ?  Comment: weekends only  ? Drug use: No  ? Sexual activity: Not on file  ?Other Topics Concern  ? Not on file  ?Social History Narrative  ? Not on file  ? ?Social Determinants of Health  ? ?Financial Resource Strain: Not on file  ?Food Insecurity: Not on file  ?Transportation Needs: Not on file  ?Physical Activity: Not on file  ?Stress: Not on file  ?Social Connections: Not on file  ?Intimate Partner Violence: Not  on file  ? ? ?Past Surgical History:  ?Procedure Laterality Date  ? KNEE ARTHROSCOPY    ? right knee  ? WISDOM TOOTH EXTRACTION    ? ? ?Family History  ?Adopted: Yes  ?Problem Relation Age of Onset  ? Colon cancer Neg Hx   ? ? ?No Known Allergies ? ?Current Outpatient Medications on File Prior to Visit  ?Medication Sig Dispense Refill  ? allopurinol (ZYLOPRIM) 300 MG tablet TAKE 1 TABLET(300 MG) BY MOUTH DAILY 90 tablet 3  ? ?No current facility-administered medications on file prior to visit.  ? ? ?BP 110/90   Pulse 90   Temp 98.6 ?F (37 ?C) (Oral)   Ht 6' (1.829 m)   Wt 295 lb (133.8 kg)   SpO2 98%   BMI 40.01 kg/m?  ? ? ?   ?Objective:  ? Physical Exam ?Vitals and nursing note reviewed.  ?Constitutional:   ?   Appearance: Normal appearance.  ?Feet:  ?   Comments: Area where left great toenail  was previously appears to be healing well. No signs of infection. Has sutures in place.  ?Skin: ?   General: Skin is warm and dry.  ?   Capillary Refill: Capillary  refill takes less than 2 seconds.  ?Neurological:  ?   General: No focal deficit present.  ?   Mental Status: He is alert and oriented to person, place, and time.  ?Psychiatric:     ?   Mood and Affect: Mood normal.     ?   Behavior: Behavior normal.     ?   Thought Content: Thought content normal.     ?   Judgment: Judgment normal.  ? ? ?   ?Assessment & Plan:  ? ?1. Crush injury of toe, left, initial encounter ?-Will need sutures removed in 10-14 days  ?- Finished abx  ?- at the one week mark can start doing foot soaks with warm water and soap  ?- keep area clean and dry  ?- Signs of infection reviewed and follow up discussed ? ?[name], NP ? ? ?

## 2021-09-24 ENCOUNTER — Ambulatory Visit (INDEPENDENT_AMBULATORY_CARE_PROVIDER_SITE_OTHER): Payer: 59 | Admitting: Family Medicine

## 2021-09-24 ENCOUNTER — Encounter: Payer: Self-pay | Admitting: Family Medicine

## 2021-09-24 VITALS — BP 128/72 | HR 87 | Temp 97.4°F | Ht 72.0 in | Wt 289.2 lb

## 2021-09-24 DIAGNOSIS — L03032 Cellulitis of left toe: Secondary | ICD-10-CM

## 2021-09-24 DIAGNOSIS — S97102D Crushing injury of unspecified left toe(s), subsequent encounter: Secondary | ICD-10-CM

## 2021-09-24 MED ORDER — CEPHALEXIN 500 MG PO CAPS: 500.0000 mg | ORAL_CAPSULE | Freq: Four times a day (QID) | ORAL | 0 refills | Status: DC

## 2021-09-24 MED ORDER — MUPIROCIN 2 % EX OINT
1.0000 | TOPICAL_OINTMENT | Freq: Two times a day (BID) | CUTANEOUS | 0 refills | Status: DC
Start: 2021-09-24 — End: 2022-07-07

## 2021-09-24 NOTE — Patient Instructions (Signed)
Clean daily with soap and water  ? ?Start the antibiotic orally and apply topically two times daily ?

## 2021-09-24 NOTE — Progress Notes (Signed)
? ?Established Patient Office Visit ? ?Subjective:  ?Patient ID: Steven Peters, male    DOB: 10-Jun-1966  Age: 56 y.o. MRN: [id number] ? ?CC:  ?Chief Complaint  ?Patient presents with  ? Suture / Staple Removal  ? ? ?HPI ?Steven Peters presents for follow-up regarding crush injury to great toe which occurred couple weeks ago when he was in [place].  He states there were no underlying fractures.  He had combination of dissolvable sutures and nondissolvable sutures placed at that time.  He apparently had significant bleeding.  Nail was avulsed.  He was treated with Augmentin for several days and he completed that but has seen little bit of redness surrounding the toe since then.  Has been using topical antibiotic daily.  No pain.  No fevers or chills.  He is concerned because he is leaving for [place] for 3 weeks for work soon.  Tetanus up-to-date. ? ?Past Medical History:  ?Diagnosis Date  ? Allergy   ? Cough   ? Gout   ? History of gout 08/26/2014  ? Hypertension   ? Left hip pain 06/15/2011  ? Obesity   ? SOB (shortness of breath)   ? Unilateral primary osteoarthritis, left knee 04/25/2018  ? ? ?Past Surgical History:  ?Procedure Laterality Date  ? KNEE ARTHROSCOPY    ? right knee  ? WISDOM TOOTH EXTRACTION    ? ? ?Family History  ?Adopted: Yes  ?Problem Relation Age of Onset  ? Colon cancer Neg Hx   ? ? ?Social History  ? ?Socioeconomic History  ? Marital status: Married  ?  Spouse name: Not on file  ? Number of children: Not on file  ? Years of education: Not on file  ? Highest education level: Some college, no degree  ?Occupational History  ? Not on file  ?Tobacco Use  ? Smoking status: Former  ? Smokeless tobacco: Never  ?Substance and Sexual Activity  ? Alcohol use: Yes  ?  Alcohol/week: 2.0 standard drinks  ?  Types: 2 Shots of liquor per week  ?  Comment: weekends only  ? Drug use: No  ? Sexual activity: Not on file  ?Other Topics Concern  ? Not on file  ?Social History Narrative  ? Not on file  ? ?Social  Determinants of Health  ? ?Financial Resource Strain: Low Risk   ? Difficulty of Paying Living Expenses: Not hard at all  ?Food Insecurity: No Food Insecurity  ? Worried About Charity fundraiser in the Last Year: Never true  ? Ran Out of Food in the Last Year: Never true  ?Transportation Needs: No Transportation Needs  ? Lack of Transportation (Medical): No  ? Lack of Transportation (Non-Medical): No  ?Physical Activity: Insufficiently Active  ? Days of Exercise per Week: 1 day  ? Minutes of Exercise per Session: 10 min  ?Stress: Stress Concern Present  ? Feeling of Stress : To some extent  ?Social Connections: Unknown  ? Frequency of Communication with Friends and Family: More than three times a week  ? Frequency of Social Gatherings with Friends and Family: Twice a week  ? Attends Religious Services: Patient refused  ? Active Member of Clubs or Organizations: No  ? Attends Archivist Meetings: Not on file  ? Marital Status: Married  ?Intimate Partner Violence: Not on file  ? ? ?Outpatient Medications Prior to Visit  ?Medication Sig Dispense Refill  ? allopurinol (ZYLOPRIM) 300 MG tablet TAKE 1 TABLET(300 MG) BY MOUTH DAILY  90 tablet 3  ? ?No facility-administered medications prior to visit.  ? ? ?No Known Allergies ? ?ROS ?Review of Systems  ?Constitutional:  Negative for chills and fever.  ? ?  ?Objective:  ?  ?Physical Exam ?Vitals reviewed.  ?Constitutional:   ?   Appearance: Normal appearance.  ?Cardiovascular:  ?   Rate and Rhythm: Normal rate and regular rhythm.  ?Skin: ?   Comments: Left great toe reveals avulsion of nail.  He has exudate over the area that is healing with multiple sutures in place including combination of what looks like absorbable and he has 4 Ethilon type sutures.  He does have a little bit of some mild erythema near the distal aspect of the toe near the nail matrix.  No purulent drainage.  Nontender.  ?Neurological:  ?   Mental Status: He is alert.  ? ? ?BP 128/72 (BP  Location: Left Arm, Patient Position: Sitting, Cuff Size: Normal)   Pulse 87   Temp (!) 97.4 ?F (36.3 ?C) (Oral)   Ht 6' (1.829 m)   Wt 289 lb 3.2 oz (131.2 kg)   SpO2 96%   BMI 39.22 kg/m?  ?Wt Readings from Last 3 Encounters:  ?09/24/21 289 lb 3.2 oz (131.2 kg)  ?09/14/21 295 lb (133.8 kg)  ?08/27/21 294 lb (133.4 kg)  ? ? ? ?Health Maintenance Due  ?Topic Date Due  ? Zoster Vaccines- Shingrix (1 of 2) Never done  ? COVID-19 Vaccine (4 - Booster for Pfizer series) 08/01/2020  ? INFLUENZA VACCINE  02/01/2021  ? COLONOSCOPY (Pts 45-49yr Insurance coverage will need to be confirmed)  04/08/2021  ? ? ?There are no preventive care reminders to display for this patient. ? ?Lab Results  ?Component Value Date  ? TSH 2.14 08/27/2021  ? ?Lab Results  ?Component Value Date  ? WBC 7.3 08/27/2021  ? HGB 18.1 Repeated and verified X2. (HPablo Pena 08/27/2021  ? HCT 53.1 (H) 08/27/2021  ? MCV 91.8 08/27/2021  ? PLT 300.0 08/27/2021  ? ?Lab Results  ?Component Value Date  ? NA 139 08/27/2021  ? K 4.8 08/27/2021  ? CO2 26 08/27/2021  ? GLUCOSE 96 08/27/2021  ? BUN 15 08/27/2021  ? CREATININE 1.13 08/27/2021  ? BILITOT 1.0 08/27/2021  ? ALKPHOS 57 08/27/2021  ? AST 22 08/27/2021  ? ALT 27 08/27/2021  ? PROT 7.5 08/27/2021  ? ALBUMIN 4.6 08/27/2021  ? CALCIUM 9.8 08/27/2021  ? ANIONGAP 8 07/27/2020  ? GFR 72.80 08/27/2021  ? ?Lab Results  ?Component Value Date  ? CHOL 185 08/27/2021  ? ?Lab Results  ?Component Value Date  ? HDL 43.10 08/27/2021  ? ?Lab Results  ?Component Value Date  ? LDLCALC 123 (H) 08/27/2021  ? ?Lab Results  ?Component Value Date  ? TRIG 93.0 08/27/2021  ? ?Lab Results  ?Component Value Date  ? CHOLHDL 4 08/27/2021  ? ?Lab Results  ?Component Value Date  ? HGBA1C 5.2 06/13/2019  ? ? ?  ?Assessment & Plan:  ? ?Recent crush injury left great toe with no underlying fracture but avulsion of nail.  Does have some mild cellulitis changes currently. ? ?-4 nonabsorbable sutures were removed ?-Clean daily with soap and  water ?-Bactroban ointment apply twice daily ?-Start Keflex 500 mg 4 times daily for 7 days ?-Follow-up for any progressive erythema or other signs and symptoms of infection ? ?Meds ordered this encounter  ?Medications  ? cephALEXin (KEFLEX) 500 MG capsule  ?  Sig: Take 1 capsule (500  mg total) by mouth 4 (four) times daily.  ?  Dispense:  28 capsule  ?  Refill:  0  ? mupirocin ointment (BACTROBAN) 2 %  ?  Sig: Apply 1 application. topically 2 (two) times daily.  ?  Dispense:  22 g  ?  Refill:  0  ? ? ?Follow-up: No follow-ups on file.  ? ? ?Carolann Littler, MD ?

## 2021-11-08 ENCOUNTER — Encounter: Payer: Self-pay | Admitting: Orthopaedic Surgery

## 2021-11-08 ENCOUNTER — Ambulatory Visit (INDEPENDENT_AMBULATORY_CARE_PROVIDER_SITE_OTHER): Payer: 59 | Admitting: Orthopaedic Surgery

## 2021-11-08 VITALS — Ht 72.0 in | Wt 298.6 lb

## 2021-11-08 DIAGNOSIS — M25561 Pain in right knee: Secondary | ICD-10-CM | POA: Diagnosis not present

## 2021-11-08 DIAGNOSIS — G8929 Other chronic pain: Secondary | ICD-10-CM | POA: Diagnosis not present

## 2021-11-08 MED ORDER — LIDOCAINE HCL 1 % IJ SOLN
3.0000 mL | INTRAMUSCULAR | Status: AC | PRN
  Administered 2021-11-08: 3 mL

## 2021-11-08 MED ORDER — METHYLPREDNISOLONE ACETATE 40 MG/ML IJ SUSP
40.0000 mg | INTRAMUSCULAR | Status: AC | PRN
  Administered 2021-11-08: 40 mg via INTRA_ARTICULAR

## 2021-11-08 NOTE — Progress Notes (Signed)
? ?Office Visit Note ?  ?Patient: Steven Peters           ?Date of Birth: 10/04/1965           ?MRN: [id number] ?Visit Date: 11/08/2021 ?             ?Requested by: [name], NP ?[address],  [place] [id number] ?PCP: [name], NP ? ? ?Assessment & Plan: ?Visit Diagnoses:  ?1. Chronic pain of right knee   ? ? ?Plan: Per his request I did aspirate his right knee and got about 15 to 20 cc of clear yellow fluid from the knee.  I then placed a steroid injection in the knee.  I did show him quad strengthening exercises that I want him to try several sets a few times a day and this can help strengthen his knees.  All questions and concerns were answered and addressed.  Follow-up is as needed. ? ?Follow-Up Instructions: Return if symptoms worsen or fail to improve.  ? ?Orders:  ?Orders Placed This Encounter  ?Procedures  ? Large Joint Inj  ? ?No orders of the defined types were placed in this encounter. ? ? ? ? Procedures: ?Large Joint Inj: R knee on 11/08/2021 1:55 PM ?Indications: diagnostic evaluation and pain ?Details: 22 G 1.5 in needle, superolateral approach ? ?Arthrogram: No ? ?Medications: 3 mL lidocaine 1 %; 40 mg methylPREDNISolone acetate 40 MG/ML ?Outcome: tolerated well, no immediate complications ?Procedure, treatment alternatives, risks and benefits explained, specific risks discussed. Consent was given by the patient. Immediately prior to procedure a time out was called to verify the correct patient, procedure, equipment, support staff and site/side marked as required. Patient was prepped and draped in the usual sterile fashion.  ? ? ? ? ?Clinical Data: ?No additional findings. ? ? ?Subjective: ?Chief Complaint  ?Patient presents with  ? Right Knee - Pain  ? Left Knee - Pain  ?The patient is well-known to me.  He has well-documented arthritis in both his knees.  He is only 48.  He says his right knee has been feeling weak and he has been putting more weight through the right knee  recently after right foot injury.  He says the left knee is okay.  He is taking ibuprofen every morning.  He does feel like there is some fluid in the right knee and would like to have it aspirated and the steroid injection for only the right knee today.  He had no acute change in medical status.  Is been a long time since we have injected either knee.  The last one was in his left knee which was back in November.  He has had no acute changes in medical status. ? ?HPI ? ?Review of Systems ?There is currently listed no fever, chills, nausea, vomiting ? ?Objective: ?Vital Signs: Ht 6' (1.829 m)   Wt 298 lb 9.6 oz (135.4 kg)   BMI 40.50 kg/m?  ? ?Physical Exam ?He is alert and orient x3 and in no acute distress ?Ortho Exam ?Examination of the left knee and right knee showing mild effusion.  There is more pain globally with the right knee today.  He has good range of motion of both knees and they feel stable. ?Specialty Comments:  ?No specialty comments available. ? ?Imaging: ?No results found. ? ? ?PMFS History: ?Patient Active Problem List  ? Diagnosis Date Noted  ? Unilateral primary osteoarthritis, left knee 04/25/2018  ? History of gout 08/26/2014  ? Routine  general medical examination at a health care facility 08/26/2014  ? ?Past Medical History:  ?Diagnosis Date  ? Allergy   ? Cough   ? Gout   ? History of gout 08/26/2014  ? Hypertension   ? Left hip pain 06/15/2011  ? Obesity   ? SOB (shortness of breath)   ? Unilateral primary osteoarthritis, left knee 04/25/2018  ?  ?Family History  ?Adopted: Yes  ?Problem Relation Age of Onset  ? Colon cancer Neg Hx   ?  ?Past Surgical History:  ?Procedure Laterality Date  ? KNEE ARTHROSCOPY    ? right knee  ? WISDOM TOOTH EXTRACTION    ? ?Social History  ? ?Occupational History  ? Not on file  ?Tobacco Use  ? Smoking status: Former  ? Smokeless tobacco: Never  ?Substance and Sexual Activity  ? Alcohol use: Yes  ?  Alcohol/week: 2.0 standard drinks  ?  Types: 2 Shots of  liquor per week  ?  Comment: weekends only  ? Drug use: No  ? Sexual activity: Not on file  ? ? ? ? ? ? ?

## 2022-07-07 ENCOUNTER — Ambulatory Visit (INDEPENDENT_AMBULATORY_CARE_PROVIDER_SITE_OTHER): Payer: 59 | Admitting: Adult Health

## 2022-07-07 ENCOUNTER — Encounter: Payer: Self-pay | Admitting: Adult Health

## 2022-07-07 VITALS — BP 110/90 | HR 79 | Temp 97.8°F | Ht 71.5 in | Wt 294.0 lb

## 2022-07-07 DIAGNOSIS — G4730 Sleep apnea, unspecified: Secondary | ICD-10-CM

## 2022-07-07 DIAGNOSIS — M1712 Unilateral primary osteoarthritis, left knee: Secondary | ICD-10-CM

## 2022-07-07 DIAGNOSIS — Z0000 Encounter for general adult medical examination without abnormal findings: Secondary | ICD-10-CM | POA: Diagnosis not present

## 2022-07-07 DIAGNOSIS — Z125 Encounter for screening for malignant neoplasm of prostate: Secondary | ICD-10-CM | POA: Diagnosis not present

## 2022-07-07 DIAGNOSIS — Z8739 Personal history of other diseases of the musculoskeletal system and connective tissue: Secondary | ICD-10-CM | POA: Diagnosis not present

## 2022-07-07 DIAGNOSIS — E668 Other obesity: Secondary | ICD-10-CM | POA: Diagnosis not present

## 2022-07-07 DIAGNOSIS — D751 Secondary polycythemia: Secondary | ICD-10-CM

## 2022-07-07 LAB — CBC WITH DIFFERENTIAL/PLATELET
Basophils Absolute: 0 10*3/uL (ref 0.0–0.1)
Basophils Relative: 0.8 % (ref 0.0–3.0)
Eosinophils Absolute: 0.4 10*3/uL (ref 0.0–0.7)
Eosinophils Relative: 6 % — ABNORMAL HIGH (ref 0.0–5.0)
HCT: 53 % — ABNORMAL HIGH (ref 39.0–52.0)
Hemoglobin: 17.9 g/dL — ABNORMAL HIGH (ref 13.0–17.0)
Lymphocytes Relative: 27.8 % (ref 12.0–46.0)
Lymphs Abs: 1.7 10*3/uL (ref 0.7–4.0)
MCHC: 33.9 g/dL (ref 30.0–36.0)
MCV: 91.6 fl (ref 78.0–100.0)
Monocytes Absolute: 0.5 10*3/uL (ref 0.1–1.0)
Monocytes Relative: 8.8 % (ref 3.0–12.0)
Neutro Abs: 3.4 10*3/uL (ref 1.4–7.7)
Neutrophils Relative %: 56.6 % (ref 43.0–77.0)
Platelets: 275 10*3/uL (ref 150.0–400.0)
RBC: 5.79 Mil/uL (ref 4.22–5.81)
RDW: 14 % (ref 11.5–15.5)
WBC: 6.1 10*3/uL (ref 4.0–10.5)

## 2022-07-07 LAB — LIPID PANEL
Cholesterol: 189 mg/dL (ref 0–200)
HDL: 46.1 mg/dL (ref >39.00)
LDL Cholesterol: 123 mg/dL — ABNORMAL HIGH (ref 0–99)
NonHDL: 143.05
Total CHOL/HDL Ratio: 4
Triglycerides: 100 mg/dL (ref 0.0–149.0)
VLDL: 20 mg/dL (ref 0.0–40.0)

## 2022-07-07 LAB — COMPREHENSIVE METABOLIC PANEL
ALT: 22 U/L (ref 0–53)
AST: 20 U/L (ref 0–37)
Albumin: 4.4 g/dL (ref 3.5–5.2)
Alkaline Phosphatase: 53 U/L (ref 39–117)
BUN: 13 mg/dL (ref 6–23)
CO2: 28 mEq/L (ref 19–32)
Calcium: 9.6 mg/dL (ref 8.4–10.5)
Chloride: 102 mEq/L (ref 96–112)
Creatinine, Ser: 1.11 mg/dL (ref 0.40–1.50)
GFR: 73.93 mL/min (ref >60.00)
Glucose, Bld: 98 mg/dL (ref 70–99)
Potassium: 4.8 mEq/L (ref 3.5–5.1)
Sodium: 139 mEq/L (ref 135–145)
Total Bilirubin: 0.9 mg/dL (ref 0.2–1.2)
Total Protein: 7.2 g/dL (ref 6.0–8.3)

## 2022-07-07 LAB — PSA: PSA: 0.4 ng/mL (ref 0.10–4.00)

## 2022-07-07 MED ORDER — ALLOPURINOL 300 MG PO TABS: ORAL_TABLET | ORAL | 3 refills | Status: DC

## 2022-07-07 NOTE — Progress Notes (Signed)
Subjective:    Patient ID: Steven Peters, male    DOB: Jun 09, 1966, 57 y.o.   MRN: [id number]  HPI Patient presents for yearly preventative medicine examination. He is a 57 year old male who  has a past medical history of Allergy, Cough, Gout, History of gout (08/26/2014), Hypertension, Left hip pain (06/15/2011), Obesity, SOB (shortness of breath), and Unilateral primary osteoarthritis, left knee (04/25/2018).  History of gout-takes allopurinol 300 mg daily.  He denies any recent gout flares  Obesity-he has started exercising with bike riding a few days a week but his diet is still " piss pour". He travels weekly to [facility] [place] and spends most of the week there.  Wt Readings from Last 3 Encounters:  07/07/22 294 lb (133.4 kg)  11/08/21 298 lb 9.6 oz (135.4 kg)  09/24/21 289 lb 3.2 oz (131.2 kg)   Secondary polycythemia-in the setting of likely sleep apnea.  He has refused sleep studies in the past due to time constraints.   Osteoarthritis -known tricompartmental arthritis of bilateral knees.  He is followed by orthopedics for intermittent knee aspirations  Hyperlipidemia-mildly elevated LDL cholesterol in the past. Not currently on medication  Lab Results  Component Value Date   CHOL 185 08/27/2021   HDL 43.10 08/27/2021   LDLCALC 123 (H) 08/27/2021   TRIG 93.0 08/27/2021   CHOLHDL 4 08/27/2021    All immunizations and health maintenance protocols were reviewed with the patient and needed orders were placed.  Appropriate screening laboratory values were ordered for the patient including screening of hyperlipidemia, renal function and hepatic function.  Medication reconciliation,  past medical history, social history, problem list and allergies were reviewed in detail with the patient  Goals were established with regard to weight loss, exercise, and  diet in compliance with medications.  He is overdue for his colonoscopy and was notified in 04/2021  Review of Systems   Constitutional: Negative.   HENT: Negative.    Eyes: Negative.   Respiratory: Negative.    Cardiovascular: Negative.   Gastrointestinal: Negative.   Endocrine: Negative.   Genitourinary: Negative.   Musculoskeletal:  Positive for arthralgias.  Skin: Negative.   Allergic/Immunologic: Negative.   Neurological: Negative.   Hematological: Negative.   Psychiatric/Behavioral: Negative.    All other systems reviewed and are negative.  Past Medical History:  Diagnosis Date   Allergy    Cough    Gout    History of gout 08/26/2014   Hypertension    Left hip pain 06/15/2011   Obesity    SOB (shortness of breath)    Unilateral primary osteoarthritis, left knee 04/25/2018    Social History   Socioeconomic History   Marital status: Married    Spouse name: Not on file   Number of children: Not on file   Years of education: Not on file   Highest education level: Some college, no degree  Occupational History   Not on file  Tobacco Use   Smoking status: Former   Smokeless tobacco: Never  Substance and Sexual Activity   Alcohol use: Yes    Alcohol/week: 2.0 standard drinks of alcohol    Types: 2 Shots of liquor per week    Comment: weekends only   Drug use: No   Sexual activity: Not on file  Other Topics Concern   Not on file  Social History Narrative   Not on file   Social Determinants of Health   Financial Resource Strain: Low Risk  (09/20/2021)   Overall  Financial Resource Strain (CARDIA)    Difficulty of Paying Living Expenses: Not hard at all  Food Insecurity: No Food Insecurity (09/20/2021)   Hunger Vital Sign    Worried About Running Out of Food in the Last Year: Never true    Ran Out of Food in the Last Year: Never true  Transportation Needs: No Transportation Needs (09/20/2021)   PRAPARE - Hydrologist (Medical): No    Lack of Transportation (Non-Medical): No  Physical Activity: Insufficiently Active (09/20/2021)   Exercise Vital Sign     Days of Exercise per Week: 1 day    Minutes of Exercise per Session: 10 min  Stress: Stress Concern Present (09/20/2021)   [place]    Feeling of Stress : To some extent  Social Connections: Unknown (09/20/2021)   Social Connection and Isolation Panel [NHANES]    Frequency of Communication with Friends and Family: More than three times a week    Frequency of Social Gatherings with Friends and Family: Twice a week    Attends Religious Services: Patient refused    Marine scientist or Organizations: No    Attends Music therapist: Not on file    Marital Status: Married  Human resources officer Violence: Not on file    Past Surgical History:  Procedure Laterality Date   KNEE ARTHROSCOPY     right knee   WISDOM TOOTH EXTRACTION      Family History  Adopted: Yes  Problem Relation Age of Onset   Colon cancer Neg Hx     No Known Allergies  No current outpatient medications on file prior to visit.   No current facility-administered medications on file prior to visit.    BP (!) 110/90   Pulse 79   Temp 97.8 F (36.6 C) (Oral)   Ht 5' 11.5" (1.816 m)   Wt 294 lb (133.4 kg)   SpO2 98%   BMI 40.43 kg/m       Objective:   Physical Exam Vitals and nursing note reviewed.  Constitutional:      General: He is not in acute distress.    Appearance: Normal appearance. He is well-developed. He is obese.  HENT:     Head: Normocephalic and atraumatic.     Right Ear: Tympanic membrane, ear canal and external ear normal. There is no impacted cerumen.     Left Ear: Tympanic membrane, ear canal and external ear normal. There is no impacted cerumen.     Nose: Nose normal. No congestion or rhinorrhea.     Mouth/Throat:     Mouth: Mucous membranes are moist.     Pharynx: Oropharynx is clear. No oropharyngeal exudate or posterior oropharyngeal erythema.  Eyes:     General:        Right eye: No discharge.         Left eye: No discharge.     Extraocular Movements: Extraocular movements intact.     Conjunctiva/sclera: Conjunctivae normal.     Pupils: Pupils are equal, round, and reactive to light.  Neck:     Vascular: No carotid bruit.     Trachea: No tracheal deviation.  Cardiovascular:     Rate and Rhythm: Normal rate and regular rhythm.     Pulses: Normal pulses.     Heart sounds: Normal heart sounds. No murmur heard.    No friction rub. No gallop.  Pulmonary:     Effort: Pulmonary  effort is normal. No respiratory distress.     Breath sounds: Normal breath sounds. No stridor. No wheezing, rhonchi or rales.  Chest:     Chest wall: No tenderness.  Abdominal:     General: Bowel sounds are normal. There is no distension.     Palpations: Abdomen is soft. There is no mass.     Tenderness: There is no abdominal tenderness. There is no right CVA tenderness, left CVA tenderness, guarding or rebound.     Hernia: No hernia is present.  Musculoskeletal:        General: No swelling, tenderness, deformity or signs of injury. Normal range of motion.     Right lower leg: No edema.     Left lower leg: No edema.  Lymphadenopathy:     Cervical: No cervical adenopathy.  Skin:    General: Skin is warm and dry.     Capillary Refill: Capillary refill takes less than 2 seconds.     Coloration: Skin is not jaundiced or pale.     Findings: No bruising, erythema, lesion or rash.  Neurological:     General: No focal deficit present.     Mental Status: He is alert and oriented to person, place, and time.     Cranial Nerves: No cranial nerve deficit.     Sensory: No sensory deficit.     Motor: No weakness.     Coordination: Coordination normal.     Gait: Gait normal.     Deep Tendon Reflexes: Reflexes normal.  Psychiatric:        Mood and Affect: Mood normal.        Behavior: Behavior normal.        Thought Content: Thought content normal.        Judgment: Judgment normal.       Assessment &  Plan:  1. Routine general medical examination at a health care facility Today patient counseled on age appropriate routine health concerns for screening and prevention, each reviewed and up to date or declined. Immunizations reviewed and up to date or declined. Labs ordered and reviewed. Risk factors for depression reviewed and negative. Hearing function and visual acuity are intact. ADLs screened and addressed as needed. Functional ability and level of safety reviewed and appropriate. Education, counseling and referrals performed based on assessed risks today. Patient provided with a copy of personalized plan for preventive services.   2. Prostate cancer screening  - PSA; Future  3. Other obesity - needs significant weight loss. At this time he is not interested in weight loss medication  - CBC with Differential/Platelet; Future - Comprehensive metabolic panel; Future - Lipid panel; Future  4. History of gout - no recent flares.  - CBC with Differential/Platelet; Future - Comprehensive metabolic panel; Future - Lipid panel; Future - allopurinol (ZYLOPRIM) 300 MG tablet; TAKE 1 TABLET(300 MG) BY MOUTH DAILY  Dispense: 90 tablet; Refill: 3  5. Unilateral primary osteoarthritis, left knee -per ortho  - CBC with Differential/Platelet; Future - Comprehensive metabolic panel; Future - Lipid panel; Future  6. Sleep apnea, unspecified type - Cannot due to time constraints with work  - CBC with Differential/Platelet; Future - Comprehensive metabolic panel; Future - Lipid panel; Future  7. Secondary polycythemia - Continue to monitor  - CBC with Differential/Platelet; Future - Comprehensive metabolic panel; Future - Lipid panel; Future  [name], NP

## 2022-07-07 NOTE — Patient Instructions (Addendum)
It was great seeing you today   We will follow up with you regarding your lab work   Please let me know if you need anything   Contact [facility] to schedule your colonoscopy  Phone: [phone]

## 2022-07-11 ENCOUNTER — Encounter: Payer: Self-pay | Admitting: Internal Medicine

## 2022-07-11 ENCOUNTER — Encounter: Payer: Self-pay | Admitting: Gastroenterology

## 2022-07-22 ENCOUNTER — Ambulatory Visit (INDEPENDENT_AMBULATORY_CARE_PROVIDER_SITE_OTHER): Payer: 59 | Admitting: Physician Assistant

## 2022-07-22 DIAGNOSIS — M1712 Unilateral primary osteoarthritis, left knee: Secondary | ICD-10-CM

## 2022-07-22 MED ORDER — METHYLPREDNISOLONE ACETATE 40 MG/ML IJ SUSP
80.0000 mg | INTRAMUSCULAR | Status: AC | PRN
  Administered 2022-07-22: 80 mg via INTRA_ARTICULAR

## 2022-07-22 MED ORDER — BUPIVACAINE HCL 0.25 % IJ SOLN
2.0000 mL | INTRAMUSCULAR | Status: AC | PRN
  Administered 2022-07-22: 2 mL via INTRA_ARTICULAR

## 2022-07-22 MED ORDER — LIDOCAINE HCL 1 % IJ SOLN
3.0000 mL | INTRAMUSCULAR | Status: AC | PRN
  Administered 2022-07-22: 3 mL

## 2022-07-22 NOTE — Progress Notes (Signed)
Office Visit Note   Patient: Steven Peters           Date of Birth: 1966-03-05           MRN: [id number] Visit Date: 07/22/2022              Requested by: [name], NP [address] [place],  [place] [address] PCP: [name], NP  Chief Complaint  Patient presents with  . Left Knee - Pain  . Right Knee - Pain      HPI: Steven Peters is a pleasant 57-year-old gentleman who is a patient of Dr. Trevor Mace.  He has a history of bilateral knee arthritis.  Periodically comes in for knee aspirations.  Has not been in a while.  He thinks he may have irritated his knees by doing a lot of walking recently.  He has tried to wear better shoe wear.  His right knee is asymptomatic but he is complaining of left knee pain and swelling.  Wondering if he could get a aspiration done today and steroid injection.  Denies any fever chills or injuries  Assessment & Plan: Visit Diagnoses:  1. Unilateral primary osteoarthritis, left knee     Plan:  He admits that his right knee is not really bothering him I could not really appreciate much fluid in that knee.  So we just went forward with aspirating and injecting his left knee.  He may follow-up as needed. Follow-Up Instructions: Return if symptoms worsen or fail to improve.   Ortho Exam  Patient is alert, oriented, no adenopathy, well-dressed, normal affect, normal respiratory effort. Right knee no effusion no redness he does have some crepitus with range of motion.  No tenderness over the joint line. Left knee: He does have a moderate effusion but no erythema induration.  No warmth.  Compartments are soft and nontender he is neurovascular intact good stability  Imaging: No results found. No images are attached to the encounter.  Labs: Lab Results  Component Value Date   HGBA1C 5.2 06/13/2019   LABURIC 5.4 08/27/2021   LABURIC 9.6 (H) 01/10/2011     Lab Results  Component Value Date   ALBUMIN 4.4 07/07/2022   ALBUMIN 4.6  08/27/2021   ALBUMIN 4.1 07/27/2020    No results found for: "MG" No results found for: "VD25OH"  No results found for: "PREALBUMIN"    Latest Ref Rng & Units 07/07/2022    8:20 AM 08/27/2021    8:50 AM 07/27/2020    9:41 AM  CBC EXTENDED  WBC 4.0 - 10.5 K/uL 6.1  7.3    RBC 4.22 - 5.81 Mil/uL 5.79  5.79  5.70   Hemoglobin 13.0 - 17.0 g/dL 17.9  18.1 Repeated and verified X2.    HCT 39.0 - 52.0 % 53.0  53.1    Platelets 150.0 - 400.0 K/uL 275.0  300.0    NEUT# 1.4 - 7.7 K/uL 3.4  4.2    Lymph# 0.7 - 4.0 K/uL 1.7  2.1       There is no height or weight on file to calculate BMI.  Orders:  No orders of the defined types were placed in this encounter.  No orders of the defined types were placed in this encounter.    Procedures: Large Joint Inj: L knee on 07/22/2022 1:56 PM Indications: pain and diagnostic evaluation Details: 25 G 1.5 in needle, superolateral approach  Arthrogram: No  Medications: 80 mg methylPREDNISolone acetate 40 MG/ML; 3 mL lidocaine 1 %;  2 mL bupivacaine 0.25 % Outcome: tolerated well, no immediate complications  After verbal consent was obtained the superior lateral access to his left knee was prepped with alcohol followed by Betadine.  This was injected with 3 cc of lidocaine plain.  After adequate anesthesia was achieved 18-gauge needle was injected on a 25 cc syringe.  27 cc of blood-tinged serous fluid was withdrawn.  80 mg of Depo-Medrol was injected.  Band-Aid applied Ace wrap applied for compression.  Patient tolerated procedure well Procedure, treatment alternatives, risks and benefits explained, specific risks discussed. Consent was given by the patient.    Clinical Data: No additional findings.  ROS:  All other systems negative, except as noted in the HPI. Review of Systems  Objective: Vital Signs: There were no vitals taken for this visit.  Specialty Comments:  No specialty comments available.  PMFS History: Patient Active Problem  List   Diagnosis Date Noted  . Unilateral primary osteoarthritis, left knee 04/25/2018  . History of gout 08/26/2014  . Routine general medical examination at a health care facility 08/26/2014   Past Medical History:  Diagnosis Date  . Allergy   . Cough   . Gout   . History of gout 08/26/2014  . Hypertension   . Left hip pain 06/15/2011  . Obesity   . SOB (shortness of breath)   . Unilateral primary osteoarthritis, left knee 04/25/2018    Family History  Adopted: Yes  Problem Relation Age of Onset  . Colon cancer Neg Hx     Past Surgical History:  Procedure Laterality Date  . KNEE ARTHROSCOPY     right knee  . WISDOM TOOTH EXTRACTION     Social History   Occupational History  . Not on file  Tobacco Use  . Smoking status: Former  . Smokeless tobacco: Never  Substance and Sexual Activity  . Alcohol use: Yes    Alcohol/week: 2.0 standard drinks of alcohol    Types: 2 Shots of liquor per week    Comment: weekends only  . Drug use: No  . Sexual activity: Not on file

## 2022-08-12 ENCOUNTER — Ambulatory Visit (AMBULATORY_SURGERY_CENTER): Payer: 59

## 2022-08-12 VITALS — Ht 72.0 in | Wt 290.0 lb

## 2022-08-12 DIAGNOSIS — Z86010 Personal history of colonic polyps: Secondary | ICD-10-CM

## 2022-08-12 MED ORDER — NA SULFATE-K SULFATE-MG SULF 17.5-3.13-1.6 GM/177ML PO SOLN: 1.0000 | Freq: Once | ORAL | 0 refills | Status: AC

## 2022-08-12 NOTE — Progress Notes (Signed)
No egg or soy allergy known to patient  No issues known to pt with past sedation with any surgeries or procedures Patient denies ever being told they had issues or difficulty with intubation  No FH of Malignant Hyperthermia Pt is not on diet pills Pt is not on  home 02  Pt is not on blood thinners  Pt denies issues with constipation  No A fib or A flutter Have any cardiac testing pending--no Pt instructed to use Singlecare.com or GoodRx for a price reduction on prep   

## 2022-08-22 ENCOUNTER — Encounter: Payer: Self-pay | Admitting: Gastroenterology

## 2022-09-02 ENCOUNTER — Ambulatory Visit (AMBULATORY_SURGERY_CENTER): Payer: 59 | Admitting: Gastroenterology

## 2022-09-02 ENCOUNTER — Encounter: Payer: Self-pay | Admitting: Gastroenterology

## 2022-09-02 VITALS — BP 107/75 | HR 72 | Temp 97.1°F | Resp 19 | Ht 71.0 in | Wt 290.0 lb

## 2022-09-02 DIAGNOSIS — Z09 Encounter for follow-up examination after completed treatment for conditions other than malignant neoplasm: Secondary | ICD-10-CM

## 2022-09-02 DIAGNOSIS — D125 Benign neoplasm of sigmoid colon: Secondary | ICD-10-CM | POA: Diagnosis not present

## 2022-09-02 DIAGNOSIS — K635 Polyp of colon: Secondary | ICD-10-CM

## 2022-09-02 DIAGNOSIS — Z86010 Personal history of colonic polyps: Secondary | ICD-10-CM | POA: Diagnosis not present

## 2022-09-02 MED ORDER — SODIUM CHLORIDE 0.9 % IV SOLN: 500.0000 mL | INTRAVENOUS | Status: DC

## 2022-09-02 NOTE — Progress Notes (Signed)
Called to room to assist during endoscopic procedure.  Patient ID and intended procedure confirmed with present staff. Received instructions for my participation in the procedure from the performing physician.  

## 2022-09-02 NOTE — Patient Instructions (Signed)
YOU HAD AN ENDOSCOPIC PROCEDURE TODAY AT THE [facility]:   Refer to the procedure report that was given to you for any specific questions about what was found during the examination.  If the procedure report does not answer your questions, please call your gastroenterologist to clarify.  If you requested that your care partner not be given the details of your procedure findings, then the procedure report has been included in a sealed envelope for you to review at your convenience later.  YOU SHOULD EXPECT: Some feelings of bloating in the abdomen. Passage of more gas than usual.  Walking can help get rid of the air that was put into your GI tract during the procedure and reduce the bloating. If you had a lower endoscopy (such as a colonoscopy or flexible sigmoidoscopy) you may notice spotting of blood in your stool or on the toilet paper. If you underwent a bowel prep for your procedure, you may not have a normal bowel movement for a few days.  Please Note:  You might notice some irritation and congestion in your nose or some drainage.  This is from the oxygen used during your procedure.  There is no need for concern and it should clear up in a day or so.  SYMPTOMS TO REPORT IMMEDIATELY:  Following lower endoscopy (colonoscopy or flexible sigmoidoscopy):  Excessive amounts of blood in the stool  Significant tenderness or worsening of abdominal pains  Swelling of the abdomen that is new, acute  Fever of 100F or higher  For urgent or emergent issues, a gastroenterologist can be reached at any hour by calling [phone]. Do not use MyChart messaging for urgent concerns.    DIET:  We do recommend a small meal at first, but then you may proceed to your regular diet.  Drink plenty of fluids but you should avoid alcoholic beverages for 24 hours.  ACTIVITY:  You should plan to take it easy for the rest of today and you should NOT DRIVE or use heavy machinery until tomorrow (because of  the sedation medicines used during the test).    FOLLOW UP: Our staff will call the number listed on your records the next business day following your procedure.  We will call around 7:15- 8:00 am to check on you and address any questions or concerns that you may have regarding the information given to you following your procedure. If we do not reach you, we will leave a message.     If any biopsies were taken you will be contacted by phone or by letter within the next 1-3 weeks.  Please call us at [phone] if you have not heard about the biopsies in 3 weeks.    SIGNATURES/CONFIDENTIALITY: You and/or your care partner have signed paperwork which will be entered into your electronic medical record.  These signatures attest to the fact that that the information above on your After Visit Summary has been reviewed and is understood.  Full responsibility of the confidentiality of this discharge information lies with you and/or your care-partner. 

## 2022-09-02 NOTE — Progress Notes (Signed)
History & Physical  Primary Care Physician:  [name], NP Primary Gastroenterologist: Lucio Edward, MD  Impression / Plan:  Personal history of adenomatous colon polyps for surveillance colonoscopy.  CHIEF COMPLAINT:  CRC screening, Personal history of colon polyps   HPI: Steven Peters is a 57 y.o. male with a personal history of adenomatous colon polyps for surveillance colonoscopy.   Past Medical History:  Diagnosis Date   Allergy    Cough    Gout    History of gout 08/26/2014   Hypertension    Left hip pain 06/15/2011   Obesity    SOB (shortness of breath)    Unilateral primary osteoarthritis, left knee 04/25/2018    Past Surgical History:  Procedure Laterality Date   KNEE ARTHROSCOPY     right knee   WISDOM TOOTH EXTRACTION      Prior to Admission medications   Medication Sig Start Date End Date Taking? Authorizing Provider  allopurinol (ZYLOPRIM) 300 MG tablet TAKE 1 TABLET(300 MG) BY MOUTH DAILY 07/07/22  Yes Nafziger, Tommi Rumps, NP  IBUPROFEN PO Take 3 tablets by mouth daily.   Yes [provider]    Current Outpatient Medications  Medication Sig Dispense Refill   allopurinol (ZYLOPRIM) 300 MG tablet TAKE 1 TABLET(300 MG) BY MOUTH DAILY 90 tablet 3   IBUPROFEN PO Take 3 tablets by mouth daily.     Current Facility-Administered Medications  Medication Dose Route Frequency Provider Last Rate Last Admin   0.9 %  sodium chloride infusion  500 mL Intravenous Continuous Ladene Artist, MD        Allergies as of 09/02/2022   (No Known Allergies)    Family History  Adopted: Yes  Problem Relation Age of Onset   Colon cancer Neg Hx    Colon polyps Neg Hx     Social History   Socioeconomic History   Marital status: Married    Spouse name: Not on file   Number of children: Not on file   Years of education: Not on file   Highest education level: Some college, no degree  Occupational History   Not on file  Tobacco Use   Smoking status:  Former   Smokeless tobacco: Never  Substance and Sexual Activity   Alcohol use: Yes    Alcohol/week: 2.0 standard drinks of alcohol    Types: 2 Shots of liquor per week    Comment: weekends only   Drug use: No   Sexual activity: Not on file  Other Topics Concern   Not on file  Social History Narrative   Not on file   Social Determinants of Health   Financial Resource Strain: Low Risk  (09/20/2021)   Overall Financial Resource Strain (CARDIA)    Difficulty of Paying Living Expenses: Not hard at all  Food Insecurity: No Food Insecurity (09/20/2021)   Hunger Vital Sign    Worried About Running Out of Food in the Last Year: Never true    Ran Out of Food in the Last Year: Never true  Transportation Needs: No Transportation Needs (09/20/2021)   PRAPARE - Hydrologist (Medical): No    Lack of Transportation (Non-Medical): No  Physical Activity: Insufficiently Active (09/20/2021)   Exercise Vital Sign    Days of Exercise per Week: 1 day    Minutes of Exercise per Session: 10 min  Stress: Stress Concern Present (09/20/2021)   [place]  Feeling of Stress : To some extent  Social Connections: Unknown (09/20/2021)   Social Connection and Isolation Panel [NHANES]    Frequency of Communication with Friends and Family: More than three times a week    Frequency of Social Gatherings with Friends and Family: Twice a week    Attends Religious Services: Patient refused    Marine scientist or Organizations: No    Attends Music therapist: Not on file    Marital Status: Married  Human resources officer Violence: Not on file    Review of Systems:  All systems reviewed were negative except where noted in HPI.   Physical Exam: General:  Alert, well-developed, in NAD Head:  Normocephalic and atraumatic. Eyes:  Sclera clear, no icterus.   Conjunctiva pink. Ears:  Normal auditory  acuity. Mouth:  No deformity or lesions.  Neck:  Supple; no masses. Lungs:  Clear throughout to auscultation.   No wheezes, crackles, or rhonchi.  Heart:  Regular rate and rhythm; no murmurs. Abdomen:  Soft, nondistended, nontender. No masses, hepatomegaly. No palpable masses.  Normal bowel sounds.    Rectal:  Deferred   Msk:  Symmetrical without gross deformities. Extremities:  Without edema. Neurologic:  Alert and  oriented x 4; grossly normal neurologically. Skin:  Intact without significant lesions or rashes. Psych:  Alert and cooperative. Normal mood and affect.   Pricilla Riffle. Fuller Plan  09/02/2022, 11:28 AM See Shea Evans, [facility], to contact our on call provider

## 2022-09-02 NOTE — Progress Notes (Signed)
Sedate, gd SR's, VSS, report to RN 

## 2022-09-02 NOTE — Progress Notes (Signed)
Pt's states no medical or surgical changes since previsit or office visit. 

## 2022-09-02 NOTE — Op Note (Signed)
[facility] Patient Name: Steven Peters Procedure Date: 09/02/2022 11:19 AM MRN: [id number] Endoscopist: Ladene Artist , MD, [id number] Age: 57 Referring MD:  Date of Birth: [birth year] Gender: Male Account #: [id number] Procedure:                Colonoscopy Indications:              Surveillance: Personal history of adenomatous                            polyps on last colonoscopy > 5 years ago Medicines:                Monitored Anesthesia Care Procedure:                Pre-Anesthesia Assessment:                           - Prior to the procedure, a History and Physical                            was performed, and patient medications and                            allergies were reviewed. The patient's tolerance of                            previous anesthesia was also reviewed. The risks                            and benefits of the procedure and the sedation                            options and risks were discussed with the patient.                            All questions were answered, and informed consent                            was obtained. Prior Anticoagulants: The patient has                            taken no anticoagulant or antiplatelet agents. ASA                            Grade Assessment: II - A patient with mild systemic                            disease. After reviewing the risks and benefits,                            the patient was deemed in satisfactory condition to                            undergo the procedure.  After obtaining informed consent, the colonoscope                            was passed under direct vision. Throughout the                            procedure, the patient's blood pressure, pulse, and                            oxygen saturations were monitored continuously. The                            CF [id number] was introduced through the anus                            and advanced to the  the cecum, identified by                            appendiceal orifice and ileocecal valve. The                            ileocecal valve, appendiceal orifice, and rectum                            were photographed. The quality of the bowel                            preparation was good. The colonoscopy was performed                            without difficulty. The patient tolerated the                            procedure well. Scope In: 11:35:26 AM Scope Out: 11:50:45 AM Scope Withdrawal Time: 0 hours 13 minutes 23 seconds  Total Procedure Duration: 0 hours 15 minutes 19 seconds  Findings:                 The perianal and digital rectal examinations were                            normal.                           Three sessile polyps were found in the sigmoid                            colon. The polyps were 7 to 8 mm in size. These                            polyps were removed with a cold snare. Resection                            and retrieval were complete.  A few small-mouthed diverticula were found in the                            left colon. There was no evidence of diverticular                            bleeding.                           Internal hemorrhoids were found during                            retroflexion. The hemorrhoids were small and Grade                            I (internal hemorrhoids that do not prolapse).                           The exam was otherwise without abnormality on                            direct and retroflexion views. Complications:            No immediate complications. Estimated blood loss:                            None. Estimated Blood Loss:     Estimated blood loss: none. Impression:               - Three 7 to 8 mm polyps in the sigmoid colon,                            removed with a cold snare. Resected and retrieved.                           - Mild diverticulosis in the left colon.                            - Internal hemorrhoids.                           - The examination was otherwise normal on direct                            and retroflexion views. Recommendation:           - Repeat colonoscopy after studies are complete for                            surveillance based on pathology results.                           - Patient has a contact number available for                            emergencies. The signs and symptoms of potential  delayed complications were discussed with the                            patient. Return to normal activities tomorrow.                            Written discharge instructions were provided to the                            patient.                           - High fiber diet.                           - Continue present medications.                           - Await pathology results. Ladene Artist, MD 09/02/2022 11:54:22 AM This report has been signed electronically.

## 2022-09-05 ENCOUNTER — Telehealth: Payer: Self-pay | Admitting: *Deleted

## 2022-09-05 NOTE — Telephone Encounter (Signed)
  Follow up Call-     09/02/2022   11:02 AM  Call back number  Post procedure Call Back phone  # [phone]  Permission to leave phone message Yes     Patient questions:  Do you have a fever, pain , or abdominal swelling? No. Pain Score  0 *  Have you tolerated food without any problems? Yes.    Have you been able to return to your normal activities? Yes.    Do you have any questions about your discharge instructions: Diet   No. Medications  No. Follow up visit  No.  Do you have questions or concerns about your Care? No.  Actions: * If pain score is 4 or above: No action needed, pain <4.

## 2022-09-08 ENCOUNTER — Encounter: Payer: Self-pay | Admitting: Gastroenterology

## 2023-01-16 ENCOUNTER — Ambulatory Visit: Payer: 59 | Admitting: Orthopaedic Surgery

## 2023-03-20 ENCOUNTER — Ambulatory Visit (INDEPENDENT_AMBULATORY_CARE_PROVIDER_SITE_OTHER): Payer: 59 | Admitting: Orthopaedic Surgery

## 2023-03-20 DIAGNOSIS — M25561 Pain in right knee: Secondary | ICD-10-CM | POA: Diagnosis not present

## 2023-03-20 DIAGNOSIS — M1712 Unilateral primary osteoarthritis, left knee: Secondary | ICD-10-CM

## 2023-03-20 DIAGNOSIS — M25562 Pain in left knee: Secondary | ICD-10-CM | POA: Diagnosis not present

## 2023-03-20 DIAGNOSIS — M1711 Unilateral primary osteoarthritis, right knee: Secondary | ICD-10-CM

## 2023-03-20 DIAGNOSIS — G8929 Other chronic pain: Secondary | ICD-10-CM

## 2023-03-20 MED ORDER — LIDOCAINE HCL 1 % IJ SOLN
3.0000 mL | INTRAMUSCULAR | Status: AC | PRN
  Administered 2023-03-20: 3 mL

## 2023-03-20 MED ORDER — METHYLPREDNISOLONE ACETATE 40 MG/ML IJ SUSP
40.0000 mg | INTRAMUSCULAR | Status: AC | PRN
  Administered 2023-03-20: 40 mg via INTRA_ARTICULAR

## 2023-03-20 NOTE — Progress Notes (Signed)
DJ is a friend of mine a well-known patient.  He has tricompartment arthritis in both knees which is moderate.  He has had multiple aspirations and steroid injections over the years.  He is never had hyaluronic acid for his knees.  He would like to discuss that today.  On exam his right knee shows no effusion but his left knee is effusion.  Both knees have excellent range of motion with slight varus malalignment that is correctable.  Both knees have some patellofemoral crepitation and medial joint line tenderness.  Previous x-rays from 2022 showed significant medial joint space narrowing and patellofemoral narrowing.  We had a long and thorough discussion about hyaluronic acid for his knees.  I think this is the next reasonable step to consider given the failure of all forms of conservative treatment over the last few years.  Will work on ordering these injections for him.  And we will see him back once they have been approved and ordered.  All questions and concerns were addressed and answered.  This patient is diagnosed with osteoarthritis of the knee(s).    Radiographs show evidence of joint space narrowing, osteophytes, subchondral sclerosis and/or subchondral cysts.  This patient has knee pain which interferes with functional and activities of daily living.    This patient has experienced inadequate response, adverse effects and/or intolerance with conservative treatments such as acetaminophen, NSAIDS, topical creams, physical therapy or regular exercise, knee bracing and/or weight loss.   This patient has experienced inadequate response or has a contraindication to intra articular steroid injections for at least 3 months.   This patient is not scheduled to have a total knee replacement within 6 months of starting treatment with viscosupplementation.   Of note I was able to aspirate 30 cc of clear yellow fluid from his left knee consistent with arthritis and did place a steroid injection in  that knee today to temporize his pain while we await approval for hyaluronic acid for his knees.    Procedure Note  Patient: Steven Peters             Date of Birth: Feb 24, 1966           MRN: [id number]             Visit Date: 03/20/2023  Procedures: Visit Diagnoses:  1. Unilateral primary osteoarthritis, left knee   2. Primary osteoarthritis of right knee   3. Chronic pain of right knee   4. Chronic pain of left knee     Large Joint Inj: L knee on 03/20/2023 2:43 PM Indications: diagnostic evaluation and pain Details: 22 G 1.5 in needle, superolateral approach  Arthrogram: No  Medications: 3 mL lidocaine 1 %; 40 mg methylPREDNISolone acetate 40 MG/ML Outcome: tolerated well, no immediate complications Procedure, treatment alternatives, risks and benefits explained, specific risks discussed. Consent was given by the patient. Immediately prior to procedure a time out was called to verify the correct patient, procedure, equipment, support staff and site/side marked as required. Patient was prepped and draped in the usual sterile fashion.

## 2023-03-21 ENCOUNTER — Telehealth: Payer: Self-pay

## 2023-03-21 NOTE — Telephone Encounter (Signed)
Bilateral knee gel injection

## 2023-03-23 NOTE — Telephone Encounter (Signed)
VOB submitted for Durolane, bilateral knee

## 2023-04-21 ENCOUNTER — Other Ambulatory Visit: Payer: Self-pay

## 2023-04-21 DIAGNOSIS — M1712 Unilateral primary osteoarthritis, left knee: Secondary | ICD-10-CM

## 2023-04-21 DIAGNOSIS — M1711 Unilateral primary osteoarthritis, right knee: Secondary | ICD-10-CM

## 2023-04-25 ENCOUNTER — Telehealth: Payer: Self-pay | Admitting: Orthopaedic Surgery

## 2023-04-25 NOTE — Telephone Encounter (Signed)
Pt went online and made 3 separate appt for gel injections. Asked pt where the injections approved and it shows on chart for Durolane but does not show if its 3 series. Please call pt about this matter at [phone].

## 2023-04-25 NOTE — Telephone Encounter (Signed)
Talked with patients wife concerning gel injection and advised about which gel injection patient will receive.  Patients wife voiced that she understands.

## 2023-05-22 ENCOUNTER — Encounter: Payer: Self-pay | Admitting: Orthopaedic Surgery

## 2023-05-22 ENCOUNTER — Ambulatory Visit (INDEPENDENT_AMBULATORY_CARE_PROVIDER_SITE_OTHER): Payer: 59 | Admitting: Orthopaedic Surgery

## 2023-05-22 DIAGNOSIS — M1711 Unilateral primary osteoarthritis, right knee: Secondary | ICD-10-CM | POA: Insufficient documentation

## 2023-05-22 DIAGNOSIS — M170 Bilateral primary osteoarthritis of knee: Secondary | ICD-10-CM | POA: Diagnosis not present

## 2023-05-22 DIAGNOSIS — M1712 Unilateral primary osteoarthritis, left knee: Secondary | ICD-10-CM

## 2023-05-22 MED ORDER — SODIUM HYALURONATE 60 MG/3ML IX PRSY
60.0000 mg | PREFILLED_SYRINGE | INTRA_ARTICULAR | Status: AC | PRN
  Administered 2023-05-22: 60 mg via INTRA_ARTICULAR

## 2023-05-22 NOTE — Progress Notes (Signed)
   Procedure Note  Patient: Steven Peters             Date of Birth: 1965-09-12           MRN: [id number]             Visit Date: 05/22/2023  Procedures: Visit Diagnoses:  1. Unilateral primary osteoarthritis, left knee     Large Joint Inj: L knee on 05/22/2023 8:40 AM Indications: diagnostic evaluation and pain Details: 22 G 1.5 in needle, superolateral approach  Arthrogram: No  Medications: 60 mg Sodium Hyaluronate 60 MG/3ML Outcome: tolerated well, no immediate complications Procedure, treatment alternatives, risks and benefits explained, specific risks discussed. Consent was given by the patient. Immediately prior to procedure a time out was called to verify the correct patient, procedure, equipment, support staff and site/side marked as required. Patient was prepped and draped in the usual sterile fashion.    Large Joint Inj: R knee on 05/22/2023 8:41 AM Indications: diagnostic evaluation and pain Details: 22 G 1.5 in needle, superolateral approach  Arthrogram: No  Medications: 60 mg Sodium Hyaluronate 60 MG/3ML Outcome: tolerated well, no immediate complications Procedure, treatment alternatives, risks and benefits explained, specific risks discussed. Consent was given by the patient. Immediately prior to procedure a time out was called to verify the correct patient, procedure, equipment, support staff and site/side marked as required. Patient was prepped and draped in the usual sterile fashion.    The patient is a 19 year old friend of mine who comes in for bilateral knee hyaluronic acid injections to treat the pain from osteoarthritis.  He is an active 57 year old gentleman.  He has had steroid injections that did not work.  He has had no acute change in medical status.  He is actually flying to [facility].  Both knees have varus malalignment and slight effusions.  Not enough fluid drained though.  I did place Durolane in both knees which he tolerated well.  He  should take anti-inflammatories next few days and have suggested that he consider an aspirin twice daily over the next few days while he is traveling.  Follow-up is as needed.  All questions and concerns were addressed and answered.

## 2023-05-29 ENCOUNTER — Ambulatory Visit: Payer: 59 | Admitting: Orthopaedic Surgery

## 2023-06-05 ENCOUNTER — Ambulatory Visit: Payer: 59 | Admitting: Orthopaedic Surgery

## 2023-07-26 ENCOUNTER — Other Ambulatory Visit: Payer: Self-pay | Admitting: Adult Health

## 2023-07-26 DIAGNOSIS — Z8739 Personal history of other diseases of the musculoskeletal system and connective tissue: Secondary | ICD-10-CM

## 2023-07-26 MED ORDER — ALLOPURINOL 300 MG PO TABS: ORAL_TABLET | ORAL | 0 refills | Status: DC

## 2023-07-26 NOTE — Telephone Encounter (Signed)
Copied from CRM [id number]. Topic: Clinical - Medication Refill >> Jul 26, 2023  9:36 AM Denese Killings wrote: Most Recent Primary Care Visit:  Provider: Shirline Frees  Department: [facility]  Visit Type: PHYSICAL  Date: 07/07/2022  Medication: allopurinol (ZYLOPRIM) 300 MG tablet  Has the patient contacted their pharmacy? Yes (Agent: If no, request that the patient contact the pharmacy for the refill. If patient does not wish to contact the pharmacy document the reason why and proceed with request.) (Agent: If yes, when and what did the pharmacy advise?)  Is this the correct pharmacy for this prescription? Yes If no, delete pharmacy and type the correct one.  This is the patient's preferred pharmacy:   [id number] [place], [place] - [address] AT [facility] [address] [place] [address] Phone: [phone] Fax: [phone]   Has the prescription been filled recently? Yes 12/17  Is the patient out of the medication? No  Has the patient been seen for an appointment in the last year OR does the patient have an upcoming appointment? Yes  Can we respond through MyChart? Yes  Agent: Please be advised that Rx refills may take up to 3 business days. We ask that you follow-up with your pharmacy.

## 2023-08-18 ENCOUNTER — Encounter: Payer: Self-pay | Admitting: Adult Health

## 2023-08-18 ENCOUNTER — Ambulatory Visit: Payer: 59 | Admitting: Adult Health

## 2023-08-18 VITALS — BP 100/82 | HR 89 | Temp 97.9°F | Ht 71.5 in | Wt 291.0 lb

## 2023-08-18 DIAGNOSIS — Z0000 Encounter for general adult medical examination without abnormal findings: Secondary | ICD-10-CM

## 2023-08-18 DIAGNOSIS — E66813 Obesity, class 3: Secondary | ICD-10-CM

## 2023-08-18 DIAGNOSIS — E8882 Obesity due to disruption of MC4R pathway: Secondary | ICD-10-CM

## 2023-08-18 DIAGNOSIS — M1712 Unilateral primary osteoarthritis, left knee: Secondary | ICD-10-CM | POA: Diagnosis not present

## 2023-08-18 DIAGNOSIS — Z8739 Personal history of other diseases of the musculoskeletal system and connective tissue: Secondary | ICD-10-CM

## 2023-08-18 DIAGNOSIS — E782 Mixed hyperlipidemia: Secondary | ICD-10-CM | POA: Diagnosis not present

## 2023-08-18 DIAGNOSIS — Z152 Genetic susceptibility to obesity: Secondary | ICD-10-CM

## 2023-08-18 DIAGNOSIS — Z6841 Body Mass Index (BMI) 40.0 and over, adult: Secondary | ICD-10-CM

## 2023-08-18 DIAGNOSIS — Z125 Encounter for screening for malignant neoplasm of prostate: Secondary | ICD-10-CM

## 2023-08-18 DIAGNOSIS — D751 Secondary polycythemia: Secondary | ICD-10-CM | POA: Diagnosis not present

## 2023-08-18 LAB — COMPREHENSIVE METABOLIC PANEL
ALT: 29 U/L (ref 0–53)
AST: 23 U/L (ref 0–37)
Albumin: 4.4 g/dL (ref 3.5–5.2)
Alkaline Phosphatase: 60 U/L (ref 39–117)
BUN: 13 mg/dL (ref 6–23)
CO2: 28 meq/L (ref 19–32)
Calcium: 9.2 mg/dL (ref 8.4–10.5)
Chloride: 105 meq/L (ref 96–112)
Creatinine, Ser: 1.16 mg/dL (ref 0.40–1.50)
GFR: 69.57 mL/min (ref >60.00)
Glucose, Bld: 98 mg/dL (ref 70–99)
Potassium: 4.6 meq/L (ref 3.5–5.1)
Sodium: 140 meq/L (ref 135–145)
Total Bilirubin: 0.8 mg/dL (ref 0.2–1.2)
Total Protein: 7.4 g/dL (ref 6.0–8.3)

## 2023-08-18 LAB — LIPID PANEL
Cholesterol: 178 mg/dL (ref 0–200)
HDL: 42.4 mg/dL (ref >39.00)
LDL Cholesterol: 116 mg/dL — ABNORMAL HIGH (ref 0–99)
NonHDL: 135.49
Total CHOL/HDL Ratio: 4
Triglycerides: 99 mg/dL (ref 0.0–149.0)
VLDL: 19.8 mg/dL (ref 0.0–40.0)

## 2023-08-18 LAB — PSA: PSA: 0.5 ng/mL (ref 0.10–4.00)

## 2023-08-18 LAB — TSH: TSH: 2.22 u[IU]/mL (ref 0.35–5.50)

## 2023-08-18 LAB — CBC
HCT: 50.9 % (ref 39.0–52.0)
Hemoglobin: 17.1 g/dL — ABNORMAL HIGH (ref 13.0–17.0)
MCHC: 33.5 g/dL (ref 30.0–36.0)
MCV: 90.3 fL (ref 78.0–100.0)
Platelets: 280 10*3/uL (ref 150.0–400.0)
RBC: 5.63 Mil/uL (ref 4.22–5.81)
RDW: 14.2 % (ref 11.5–15.5)
WBC: 6.1 10*3/uL (ref 4.0–10.5)

## 2023-08-18 MED ORDER — ALLOPURINOL 300 MG PO TABS: ORAL_TABLET | ORAL | 3 refills | Status: AC

## 2023-08-18 NOTE — Progress Notes (Signed)
Subjective:    Patient ID: Steven Peters, male    DOB: 19-Nov-1965, 58 y.o.   MRN: [id number]  HPI Patient presents for yearly preventative medicine examination. He is a pleasant 58 year old male who  has a past medical history of Allergy, Cough, Gout, History of gout (08/26/2014), Hypertension, Left hip pain (06/15/2011), Obesity, SOB (shortness of breath), and Unilateral primary osteoarthritis, left knee (04/25/2018).  History of gout-takes allopurinol 300 mg daily.  He denies any recent gout flares  Obesity- He started exercising a few months ago when his weight was 303 lbs. He has been doing an exercise bike, walking and weight lifting. He travels to [place] a lot for work but joined a gym in [place]. Wt Readings from Last 3 Encounters:  08/18/23 291 lb (132 kg)  09/02/22 290 lb (131.5 kg)  08/12/22 290 lb (131.5 kg)   Secondary polycythemia-in the setting of likely sleep apnea.  He has refused sleep studies in the past due to time constraints.   Osteoarthritis -known tricompartmental arthritis of bilateral knees.  He is followed by orthopedics for intermittent knee injections.   Hyperlipidemia-mildly elevated LDL cholesterol in the past. Not currently on medication  Lab Results  Component Value Date   CHOL 189 07/07/2022   HDL 46.10 07/07/2022   LDLCALC 123 (H) 07/07/2022   TRIG 100.0 07/07/2022   CHOLHDL 4 07/07/2022    All immunizations and health maintenance protocols were reviewed with the patient and needed orders were placed.  Appropriate screening laboratory values were ordered for the patient including screening of hyperlipidemia, renal function and hepatic function. If indicated by BPH, a PSA was ordered.  Medication reconciliation,  past medical history, social history, problem list and allergies were reviewed in detail with the patient  Goals were established with regard to weight loss, exercise, and  diet in compliance with medications  He is up to date on  routine colon cancer screening   Review of Systems  Constitutional: Negative.   HENT: Negative.    Eyes: Negative.   Respiratory: Negative.    Cardiovascular: Negative.   Gastrointestinal: Negative.   Endocrine: Negative.   Genitourinary: Negative.   Musculoskeletal:  Positive for arthralgias.  Skin: Negative.   Allergic/Immunologic: Negative.   Neurological: Negative.   Hematological: Negative.   Psychiatric/Behavioral: Negative.    All other systems reviewed and are negative.  Past Medical History:  Diagnosis Date   Allergy    Cough    Gout    History of gout 08/26/2014   Hypertension    Left hip pain 06/15/2011   Obesity    SOB (shortness of breath)    Unilateral primary osteoarthritis, left knee 04/25/2018    Social History   Socioeconomic History   Marital status: Married    Spouse name: Not on file   Number of children: Not on file   Years of education: Not on file   Highest education level: Some college, no degree  Occupational History   Not on file  Tobacco Use   Smoking status: Former   Smokeless tobacco: Never  Substance and Sexual Activity   Alcohol use: Yes    Alcohol/week: 2.0 standard drinks of alcohol    Types: 2 Shots of liquor per week    Comment: weekends only   Drug use: No   Sexual activity: Not on file  Other Topics Concern   Not on file  Social History Narrative   Not on file   Social Drivers of Health  Financial Resource Strain: Low Risk  (09/20/2021)   Overall Financial Resource Strain (CARDIA)    Difficulty of Paying Living Expenses: Not hard at all  Food Insecurity: No Food Insecurity (09/20/2021)   Hunger Vital Sign    Worried About Running Out of Food in the Last Year: Never true    Ran Out of Food in the Last Year: Never true  Transportation Needs: No Transportation Needs (09/20/2021)   PRAPARE - Administrator, Civil Service (Medical): No    Lack of Transportation (Non-Medical): No  Physical Activity:  Insufficiently Active (09/20/2021)   Exercise Vital Sign    Days of Exercise per Week: 1 day    Minutes of Exercise per Session: 10 min  Stress: Stress Concern Present (09/20/2021)   [employer] of Occupational Health - Occupational Stress Questionnaire    Feeling of Stress : To some extent  Social Connections: Unknown (09/20/2021)   Social Connection and Isolation Panel [NHANES]    Frequency of Communication with Friends and Family: More than three times a week    Frequency of Social Gatherings with Friends and Family: Twice a week    Attends Religious Services: Patient declined    Database administrator or Organizations: No    Attends Engineer, structural: Not on file    Marital Status: Married  Catering manager Violence: Not on file    Past Surgical History:  Procedure Laterality Date   KNEE ARTHROSCOPY     right knee   WISDOM TOOTH EXTRACTION      Family History  Adopted: Yes  Problem Relation Age of Onset   Colon cancer Neg Hx    Colon polyps Neg Hx     No Known Allergies  Current Outpatient Medications on File Prior to Visit  Medication Sig Dispense Refill   allopurinol (ZYLOPRIM) 300 MG tablet TAKE 1 TABLET(300 MG) BY MOUTH DAILY 30 tablet 0   IBUPROFEN PO Take 3 tablets by mouth daily.     No current facility-administered medications on file prior to visit.    BP 100/82   Pulse 89   Temp 97.9 F (36.6 C) (Oral)   Ht 5' 11.5" (1.816 m)   Wt 291 lb (132 kg)   SpO2 97%   BMI 40.02 kg/m       Objective:   Physical Exam Vitals and nursing note reviewed.  Constitutional:      General: He is not in acute distress.    Appearance: Normal appearance. He is obese. He is not ill-appearing.  HENT:     Head: Normocephalic and atraumatic.     Right Ear: Tympanic membrane, ear canal and external ear normal. There is no impacted cerumen.     Left Ear: Tympanic membrane, ear canal and external ear normal. There is no impacted cerumen.     Nose: Nose  normal. No congestion or rhinorrhea.     Mouth/Throat:     Mouth: Mucous membranes are moist.     Pharynx: Oropharynx is clear.  Eyes:     Extraocular Movements: Extraocular movements intact.     Conjunctiva/sclera: Conjunctivae normal.     Pupils: Pupils are equal, round, and reactive to light.  Neck:     Vascular: No carotid bruit.  Cardiovascular:     Rate and Rhythm: Normal rate and regular rhythm.     Pulses: Normal pulses.     Heart sounds: No murmur heard.    No friction rub. No gallop.  Pulmonary:  Effort: Pulmonary effort is normal.     Breath sounds: Normal breath sounds.  Abdominal:     General: Abdomen is flat. Bowel sounds are normal. There is no distension.     Palpations: Abdomen is soft. There is no mass.     Tenderness: There is no abdominal tenderness. There is no guarding or rebound.     Hernia: No hernia is present.  Musculoskeletal:        General: Normal range of motion.     Cervical back: Normal range of motion and neck supple.  Lymphadenopathy:     Cervical: No cervical adenopathy.  Skin:    General: Skin is warm and dry.     Capillary Refill: Capillary refill takes less than 2 seconds.  Neurological:     General: No focal deficit present.     Mental Status: He is alert and oriented to person, place, and time.  Psychiatric:        Mood and Affect: Mood normal.        Behavior: Behavior normal.        Thought Content: Thought content normal.        Judgment: Judgment normal.       Assessment & Plan:  1. Routine general medical examination at a health care facility (Primary) Today patient counseled on age appropriate routine health concerns for screening and prevention, each reviewed and up to date or declined. Immunizations reviewed and up to date or declined. Labs ordered and reviewed. Risk factors for depression reviewed and negative. Hearing function and visual acuity are intact. ADLs screened and addressed as needed. Functional ability and  level of safety reviewed and appropriate. Education, counseling and referrals performed based on assessed risks today. Patient provided with a copy of personalized plan for preventive services. - keep working on weight loss through lifestyle modifications  - Follow up in one year or sooner if needed  2. Secondary polycythemia  - Lipid panel; Future - TSH; Future - CBC; Future - Comprehensive metabolic panel; Future  3. Unilateral primary osteoarthritis, left knee - Per orthopedics  - Lipid panel; Future - TSH; Future - CBC; Future - Comprehensive metabolic panel; Future  4. Mixed hyperlipidemia - He would like to do a CT cardiac scoring test  - Consider statin  - Lipid panel; Future - TSH; Future - CBC; Future - Comprehensive metabolic panel; Future - CT CARDIAC SCORING (SELF PAY ONLY); Future  5. Prostate cancer screening  - PSA; Future  6. History of gout  - allopurinol (ZYLOPRIM) 300 MG tablet; TAKE 1 TABLET(300 MG) BY MOUTH DAILY  Dispense: 90 tablet; Refill: 3  7. 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 His insurance does not cover weight loss medications  - Lipid panel; Future - TSH; Future - CBC; Future - Comprehensive metabolic panel; Future  Shirline Frees, NP

## 2023-08-18 NOTE — Telephone Encounter (Signed)
Is there a way to change location? Please advise

## 2023-08-21 ENCOUNTER — Ambulatory Visit (INDEPENDENT_AMBULATORY_CARE_PROVIDER_SITE_OTHER): Payer: 59 | Admitting: Orthopaedic Surgery

## 2023-08-21 DIAGNOSIS — M1712 Unilateral primary osteoarthritis, left knee: Secondary | ICD-10-CM | POA: Diagnosis not present

## 2023-08-21 DIAGNOSIS — G8929 Other chronic pain: Secondary | ICD-10-CM

## 2023-08-21 DIAGNOSIS — M25562 Pain in left knee: Secondary | ICD-10-CM

## 2023-08-21 MED ORDER — LIDOCAINE HCL 1 % IJ SOLN
3.0000 mL | INTRAMUSCULAR | Status: AC | PRN
  Administered 2023-08-21: 3 mL

## 2023-08-21 MED ORDER — METHYLPREDNISOLONE ACETATE 40 MG/ML IJ SUSP
40.0000 mg | INTRAMUSCULAR | Status: AC | PRN
Start: 2023-08-21 — End: 2023-08-21
  Administered 2023-08-21: 40 mg via INTRA_ARTICULAR

## 2023-08-21 NOTE — Progress Notes (Signed)
 DJ comes in today to have me take a look at his left knee.  He actually leaves for a business related trip to [place] this Friday and will be gone a month.  He does have known arthritis in his knees and last had hyaluronic acid in his knees back in November.  He said the right knee is doing great but the left knee has been having swelling and aching.  Examination of left knee today shows no effusion but he has global tenderness.  I try to aspirate some fluid from the knee and got a minimal amount of fluid out of the knee.  I did place a steroid injection in his knee per his request.  He knows to wear compressive socks for his long trip to [place] and if things are worsening with his knee at any time he knows to come back and see [place].  I have known DJ since my childhood.    Procedure Note  Patient: Steven Peters             Date of Birth: May 01, 1966           MRN: [id number]             Visit Date: 08/21/2023  Procedures: Visit Diagnoses:  1. Unilateral primary osteoarthritis, left knee   2. Chronic pain of left knee     Large Joint Inj: L knee on 08/21/2023 2:12 PM Indications: diagnostic evaluation and pain Details: 22 G 1.5 in needle, superolateral approach  Arthrogram: No  Medications: 3 mL lidocaine 1 %; 40 mg methylPREDNISolone acetate 40 MG/ML Outcome: tolerated well, no immediate complications Procedure, treatment alternatives, risks and benefits explained, specific risks discussed. Consent was given by the patient. Immediately prior to procedure a time out was called to verify the correct patient, procedure, equipment, support staff and site/side marked as required. Patient was prepped and draped in the usual sterile fashion.

## 2023-08-22 ENCOUNTER — Ambulatory Visit (HOSPITAL_COMMUNITY)
Admission: RE | Admit: 2023-08-22 | Discharge: 2023-08-22 | Disposition: A | Discharge status: Home / Self Care | Payer: Self-pay | Source: Ambulatory Visit | Attending: Adult Health | Admitting: Adult Health

## 2023-08-22 DIAGNOSIS — E782 Mixed hyperlipidemia: Secondary | ICD-10-CM | POA: Insufficient documentation

## 2023-08-24 ENCOUNTER — Other Ambulatory Visit: Payer: Self-pay | Admitting: Adult Health

## 2023-08-24 ENCOUNTER — Encounter: Payer: Self-pay | Admitting: Adult Health

## 2023-08-24 DIAGNOSIS — I77810 Thoracic aortic ectasia: Secondary | ICD-10-CM

## 2023-10-16 ENCOUNTER — Ambulatory Visit (INDEPENDENT_AMBULATORY_CARE_PROVIDER_SITE_OTHER): Admitting: Family Medicine

## 2023-10-16 ENCOUNTER — Ambulatory Visit: Payer: Self-pay

## 2023-10-16 ENCOUNTER — Encounter: Payer: Self-pay | Admitting: Family Medicine

## 2023-10-16 VITALS — BP 110/84 | HR 82 | Temp 97.9°F | Ht 71.5 in | Wt 297.0 lb

## 2023-10-16 DIAGNOSIS — R051 Acute cough: Secondary | ICD-10-CM

## 2023-10-16 DIAGNOSIS — J069 Acute upper respiratory infection, unspecified: Secondary | ICD-10-CM | POA: Diagnosis not present

## 2023-10-16 DIAGNOSIS — R0982 Postnasal drip: Secondary | ICD-10-CM | POA: Diagnosis not present

## 2023-10-16 LAB — POC COVID19 BINAXNOW: SARS Coronavirus 2 Ag: NEGATIVE

## 2023-10-16 NOTE — Progress Notes (Signed)
 Established Patient Office Visit   Subjective  Patient ID: Steven Peters, male    DOB: [birth year]  Age: 58 y.o. MRN: [id number]  Chief Complaint  Patient presents with   Cough    Cough congestion runny nose, coughing up blood, started 5 days ago     Patient is a 57 year old male followed by craniad finger, NP and seen for acute concern.  Patient endorses cough, congestion, rhinorrhea, ears feeling clogged x 5 days.  Symptoms started after returning from a business trip to [place].  Patient tried Mucinex and ibuprofen for symptoms.  Became concerned this morning after coughing up blood tinged phlegm.    Patient Active Problem List   Diagnosis Date Noted   Unilateral primary osteoarthritis, right knee 05/22/2023   Unilateral primary osteoarthritis, left knee 04/25/2018   History of gout 08/26/2014   Routine general medical examination at a health care facility 08/26/2014   Past Medical History:  Diagnosis Date   Allergy    Cough    Gout    History of gout 08/26/2014   Hypertension    Left hip pain 06/15/2011   Obesity    SOB (shortness of breath)    Unilateral primary osteoarthritis, left knee 04/25/2018   Past Surgical History:  Procedure Laterality Date   KNEE ARTHROSCOPY     right knee   WISDOM TOOTH EXTRACTION     Social History   Tobacco Use   Smoking status: Former   Smokeless tobacco: Never  Substance Use Topics   Alcohol use: Yes    Alcohol/week: 2.0 standard drinks of alcohol    Types: 2 Shots of liquor per week    Comment: weekends only   Drug use: No   Family History  Adopted: Yes  Problem Relation Age of Onset   Colon cancer Neg Hx    Colon polyps Neg Hx    No Known Allergies    ROS Negative unless stated above    Objective:     BP 110/84 (BP Location: Left Arm, Patient Position: Sitting, Cuff Size: Normal)   Pulse 82   Temp 97.9 F (36.6 C) (Oral)   Ht 5' 11.5" (1.816 m)   Wt 297 lb (134.7 kg)   SpO2 97%   BMI 40.85 kg/m  BP  Readings from Last 3 Encounters:  10/16/23 110/84  08/18/23 100/82  09/02/22 107/75   Wt Readings from Last 3 Encounters:  10/16/23 297 lb (134.7 kg)  08/18/23 291 lb (132 kg)  09/02/22 290 lb (131.5 kg)    Physical Exam Constitutional:      General: He is not in acute distress.    Appearance: Normal appearance.  HENT:     Head: Normocephalic and atraumatic.     Nose: Nose normal.     Comments: Dried blood in left naris.    Mouth/Throat:     Mouth: Mucous membranes are moist.     Pharynx: Postnasal drip present.  Cardiovascular:     Rate and Rhythm: Normal rate and regular rhythm.     Heart sounds: Normal heart sounds. No murmur heard.    No gallop.  Pulmonary:     Effort: Pulmonary effort is normal. No respiratory distress.     Breath sounds: Normal breath sounds. No wheezing, rhonchi or rales.  Skin:    General: Skin is warm and dry.  Neurological:     Mental Status: He is alert and oriented to person, place, and time.     Results for orders placed  or performed in visit on 10/16/23  POC COVID-19 BinaxNow  Result Value Ref Range   SARS Coronavirus 2 Ag Negative Negative      Assessment & Plan:  Acute cough -     POC COVID-19 BinaxNow  Post-nasal drainage  Upper respiratory tract infection, unspecified type   Patient with acute URI symptoms.  POC COVID testing negative.  Blood-tinged phlegm likely from nasal source.  Discussed supportive care with OTC cough/cold medications, gargling with warm salt water or Chloraseptic spray, Tylenol and NSAIDs, saline nasal rinse, and/or antihistamine.  Given strict precautions.  Return if symptoms worsen or fail to improve.   Viola Greulich, MD

## 2023-10-16 NOTE — Telephone Encounter (Signed)
 Chief Complaint: cough Symptoms: cough with congestion, blood tinged sputum, sore throat, wheezing Frequency: since last Thursday Pertinent Negatives: Patient denies fever, CP, SOB Disposition: [] ED /[] Urgent Care (no appt availability in office) / [x] Appointment(In office/virtual)/ []  [facility]/ [] Home Care/ [] Refused Recommended Disposition /[] [facility]/ []  Follow-up with PCP Additional Notes: Pt reports cough with congestion since last Friday. Pt endorses some wheezing but denies SOB or CP. Pt states he had a coughing fit in the shower today with some blood-tinged sputum. Pt denies presence of bright red blood. Denies fever. Endorses runny nose and a sore throat in the AM. RN scheduled pt for today at 1430. RN advised pt if he notices bright red blood or develops SOB or CP that he needs to go to the ED. Pt verbalized understanding.    Copied from CRM [id number]. Topic: Clinical - Red Word Triage >> Oct 16, 2023  8:29 AM Albertha Alosa wrote: [facility] that prompted transfer to Nurse Triage: Patient wife called in stating patient hasn't been feeling good, has a bad cough congestion and this morning has been coughing up blood Reason for Disposition  SEVERE coughing spells (e.g., whooping sound after coughing, vomiting after coughing)  Answer Assessment - Initial Assessment Questions 1. ONSET: "When did the cough begin?"      Last Thursday  2. SEVERITY: "How bad is the cough today?"      "When I blow my nose I will cough every time", worse in the shower 3. SPUTUM: "Describe the color of your sputum" (none, dry cough; clear, white, yellow, green)     Yellow and green 4. HEMOPTYSIS: "Are you coughing up any blood?" If so ask: "How much?" (flecks, streaks, tablespoons, etc.)     States he coughed up blood tinged sputum today in the shower 5. DIFFICULTY BREATHING: "Are you having difficulty breathing?" If Yes, ask: "How bad is it?" (e.g., mild, moderate, severe)    - MILD: No  SOB at rest, mild SOB with walking, speaks normally in sentences, can lie down, no retractions, pulse < 100.    - MODERATE: SOB at rest, SOB with minimal exertion and prefers to sit, cannot lie down flat, speaks in phrases, mild retractions, audible wheezing, pulse 100-120.    - SEVERE: Very SOB at rest, speaks in single words, struggling to breathe, sitting hunched forward, retractions, pulse > 120      No 6. FEVER: "Do you have a fever?" If Yes, ask: "What is your temperature, how was it measured, and when did it start?"     No 7. CARDIAC HISTORY: "Do you have any history of heart disease?" (e.g., heart attack, congestive heart failure)      No 8. LUNG HISTORY: "Do you have any history of lung disease?"  (e.g., pulmonary embolus, asthma, emphysema)     No 9. PE RISK FACTORS: "Do you have a history of blood clots?" (or: recent major surgery, recent prolonged travel, bedridden)     No 10. OTHER SYMPTOMS: "Do you have any other symptoms?" (e.g., runny nose, wheezing, chest pain)       Wheezing, congestion, runny nose, sore throat in the AM  Protocols used: Cough - Acute Productive-A-AH

## 2023-10-17 NOTE — Telephone Encounter (Signed)
 Noted.

## 2023-11-24 ENCOUNTER — Ambulatory Visit (INDEPENDENT_AMBULATORY_CARE_PROVIDER_SITE_OTHER): Admitting: Adult Health

## 2023-11-24 VITALS — BP 110/80 | HR 93 | Temp 98.4°F | Ht 71.25 in | Wt 296.0 lb

## 2023-11-24 DIAGNOSIS — E66813 Obesity, class 3: Secondary | ICD-10-CM

## 2023-11-24 DIAGNOSIS — E8882 Obesity due to disruption of MC4R pathway: Secondary | ICD-10-CM | POA: Diagnosis not present

## 2023-11-24 DIAGNOSIS — Z6841 Body Mass Index (BMI) 40.0 and over, adult: Secondary | ICD-10-CM | POA: Diagnosis not present

## 2023-11-24 DIAGNOSIS — Z152 Genetic susceptibility to obesity: Secondary | ICD-10-CM

## 2023-11-24 MED ORDER — INSULIN SYRINGE/NEEDLE 28G X 1/2" 1 ML MISC
3 refills | Status: AC
Start: 2023-11-24 — End: ?

## 2023-11-24 MED ORDER — TIRZEPATIDE-WEIGHT MANAGEMENT 5 MG/0.5ML ~~LOC~~ SOLN: 5.0000 mg | SUBCUTANEOUS | 0 refills | Status: DC

## 2023-11-24 NOTE — Progress Notes (Signed)
 Subjective:    Patient ID: Steven Peters, male    DOB: 09-15-1965, 58 y.o.   MRN: [id number]  HPI  58 year old male who  has a past medical history of Allergy, Cough, Gout, History of gout (08/26/2014), Hypertension, Left hip pain (06/15/2011), Obesity, SOB (shortness of breath), and Unilateral primary osteoarthritis, left knee (04/25/2018).  He presents to the office today for follow up regarding obesity and weight loss management.  He started exercising a few months ago when his weight was 303 lbs. He has been doing an exercise bike, walking and weight lifting ; he has also been eating healthy but has not been able to lose much weight. He is interested in either [place] or Zepbound and plans on paying cash for this medication as his insurance plan does not cover it.   Wt Readings from Last 3 Encounters:  11/24/23 296 lb (134.3 kg)  10/16/23 297 lb (134.7 kg)  08/18/23 291 lb (132 kg)    Review of Systems See HPI   Past Medical History:  Diagnosis Date   Allergy    Cough    Gout    History of gout 08/26/2014   Hypertension    Left hip pain 06/15/2011   Obesity    SOB (shortness of breath)    Unilateral primary osteoarthritis, left knee 04/25/2018    Social History   Socioeconomic History   Marital status: Married    Spouse name: Not on file   Number of children: Not on file   Years of education: Not on file   Highest education level: Some college, no degree  Occupational History   Not on file  Tobacco Use   Smoking status: Former   Smokeless tobacco: Never  Substance and Sexual Activity   Alcohol use: Yes    Alcohol/week: 2.0 standard drinks of alcohol    Types: 2 Shots of liquor per week    Comment: weekends only   Drug use: No   Sexual activity: Not on file  Other Topics Concern   Not on file  Social History Narrative   Not on file   Social Drivers of Health   Financial Resource Strain: Low Risk  (09/20/2021)   Overall Financial Resource Strain (CARDIA)     Difficulty of Paying Living Expenses: Not hard at all  Food Insecurity: No Food Insecurity (09/20/2021)   Hunger Vital Sign    Worried About Running Out of Food in the Last Year: Never true    Ran Out of Food in the Last Year: Never true  Transportation Needs: No Transportation Needs (09/20/2021)   PRAPARE - Administrator, Civil Service (Medical): No    Lack of Transportation (Non-Medical): No  Physical Activity: Insufficiently Active (09/20/2021)   Exercise Vital Sign    Days of Exercise per Week: 1 day    Minutes of Exercise per Session: 10 min  Stress: Stress Concern Present (09/20/2021)   [employer] of Occupational Health - Occupational Stress Questionnaire    Feeling of Stress : To some extent  Social Connections: Unknown (09/20/2021)   Social Connection and Isolation Panel [NHANES]    Frequency of Communication with Friends and Family: More than three times a week    Frequency of Social Gatherings with Friends and Family: Twice a week    Attends Religious Services: Patient declined    Database administrator or Organizations: No    Attends Banker Meetings: Not on file    Marital  Status: Married  Catering manager Violence: Not on file    Past Surgical History:  Procedure Laterality Date   KNEE ARTHROSCOPY     right knee   WISDOM TOOTH EXTRACTION      Family History  Adopted: Yes  Problem Relation Age of Onset   Colon cancer Neg Hx    Colon polyps Neg Hx     No Known Allergies  Current Outpatient Medications on File Prior to Visit  Medication Sig Dispense Refill   allopurinol  (ZYLOPRIM ) 300 MG tablet TAKE 1 TABLET(300 MG) BY MOUTH DAILY 90 tablet 3   IBUPROFEN PO Take 3 tablets by mouth daily.     No current facility-administered medications on file prior to visit.    BP 110/80   Pulse 93   Temp 98.4 F (36.9 C) (Oral)   Ht 5' 11.25" (1.81 m)   Wt 296 lb (134.3 kg)   SpO2 97%   BMI 40.99 kg/m       Objective:    Physical Exam Vitals and nursing note reviewed.  Constitutional:      Appearance: Normal appearance. He is obese.  Cardiovascular:     Rate and Rhythm: Normal rate and regular rhythm.     Pulses: Normal pulses.     Heart sounds: Normal heart sounds.  Pulmonary:     Effort: Pulmonary effort is normal.     Breath sounds: Normal breath sounds.  Skin:    General: Skin is warm and dry.  Neurological:     General: No focal deficit present.     Mental Status: He is alert and oriented to person, place, and time.  Psychiatric:        Mood and Affect: Mood normal.        Behavior: Behavior normal.        Thought Content: Thought content normal.        Judgment: Judgment normal.       Assessment & Plan:  1. 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  Dispense: 2 mL; Refill: 0 - INS SYRINGE/NEEDLE 1CC/28G 28G X 1/2" 1 ML MISC; Use one pen weekly  Dispense: 10 each; Refill: 3   Alto Atta, NP  Time spent with patient today was 32 minutes which consisted of chart review, discussing obesity,  work up, treatment answering questions and documentation.

## 2023-12-08 ENCOUNTER — Encounter: Payer: Self-pay | Admitting: Physician Assistant

## 2023-12-08 ENCOUNTER — Ambulatory Visit: Admitting: Physician Assistant

## 2023-12-08 DIAGNOSIS — M1711 Unilateral primary osteoarthritis, right knee: Secondary | ICD-10-CM

## 2023-12-08 DIAGNOSIS — M170 Bilateral primary osteoarthritis of knee: Secondary | ICD-10-CM

## 2023-12-08 DIAGNOSIS — M1712 Unilateral primary osteoarthritis, left knee: Secondary | ICD-10-CM

## 2023-12-08 MED ORDER — LIDOCAINE HCL 1 % IJ SOLN
4.0000 mL | INTRAMUSCULAR | Status: AC | PRN
  Administered 2023-12-08: 4 mL

## 2023-12-08 MED ORDER — METHYLPREDNISOLONE ACETATE 40 MG/ML IJ SUSP
40.0000 mg | INTRAMUSCULAR | Status: AC | PRN
  Administered 2023-12-08: 40 mg via INTRA_ARTICULAR

## 2023-12-08 NOTE — Progress Notes (Signed)
 Office Visit Note   Patient: Steven Peters           Date of Birth: 09-23-1965           MRN: [id number] Visit Date: 12/08/2023              Requested by: Steven Atta, NP [address] [place],  [place] [address] PCP: Steven Atta, NP   Assessment & Plan: Visit Diagnoses:  1. Unilateral primary osteoarthritis, right knee   2. Unilateral primary osteoarthritis, left knee     Plan: Patient may follow-up with Dr. Lucienne Peters as needed I did instruct him in some close chain strengthening exercises for his quadricep as he seems to have a lot of pain going up and down stairs with one of his knees  Follow-Up Instructions: No follow-ups on file.   Orders:  No orders of the defined types were placed in this encounter.  No orders of the defined types were placed in this encounter.     Procedures: Large Joint Inj: bilateral knee on 12/08/2023 9:05 AM Indications: pain and diagnostic evaluation Details: 22 G 1.5 in needle  Arthrogram: No  Medications (Right): 4 mL lidocaine  1 %; 40 mg methylPREDNISolone  acetate 40 MG/ML Medications (Left): 4 mL lidocaine  1 %; 40 mg methylPREDNISolone  acetate 40 MG/ML Outcome: tolerated well, no immediate complications  After verbal consent was obtained the superior lateral pouch of the right knee was prepped with alcohol followed by Betadine.  3 cc of lidocaine  plain was injected.  After adequate analgesia 18-gauge needle was inserted less than 5 cc of clear joint fluid was aspirated.  Was injected with 40 cc of Depo-Medrol  Procedure, treatment alternatives, risks and benefits explained, specific risks discussed. Consent was given by the patient.     Clinical Data: No additional findings.   Subjective: No chief complaint on file.   HPI patient is a pleasant 58 year old gentleman patient of Dr. Arvella Peters has a history of osteoarthritis of his bilateral knees has had repeated aspirations.  Comes in today complaining of right knee  swelling and pain and left knee pain.  No injury no fever or chills.  He is requesting aspiration and injection today.   Review of Systems  All other systems reviewed and are negative.    Objective: Vital Signs: There were no vitals taken for this visit.  Physical Exam Constitutional:      Appearance: Normal appearance.  Skin:    General: Skin is warm and dry.  Neurological:     General: No focal deficit present.     Mental Status: He is alert and oriented to person, place, and time.  Psychiatric:        Mood and Affect: Mood normal.        Behavior: Behavior normal.    Ortho Exam Bilateral knees no warmth he does have a mild effusion in the right knee none in the left knee.  Compartments are soft and compressible he is neurovascularly intact there is positive crepitus with range of motion Specialty Comments:  No specialty comments available.  Imaging: No results found.   PMFS History: Patient Active Problem List   Diagnosis Date Noted   Unilateral primary osteoarthritis, right knee 05/22/2023   Unilateral primary osteoarthritis, left knee 04/25/2018   History of gout 08/26/2014   Routine general medical examination at a health care facility 08/26/2014   Past Medical History:  Diagnosis Date   Allergy    Cough    Gout    History  of gout 08/26/2014   Hypertension    Left hip pain 06/15/2011   Obesity    SOB (shortness of breath)    Unilateral primary osteoarthritis, left knee 04/25/2018    Family History  Adopted: Yes  Problem Relation Age of Onset   Colon cancer Neg Hx    Colon polyps Neg Hx     Past Surgical History:  Procedure Laterality Date   KNEE ARTHROSCOPY     right knee   WISDOM TOOTH EXTRACTION     Social History   Occupational History   Not on file  Tobacco Use   Smoking status: Former   Smokeless tobacco: Never  Substance and Sexual Activity   Alcohol use: Yes    Alcohol/week: 2.0 standard drinks of alcohol    Types: 2 Shots of  liquor per week    Comment: weekends only   Drug use: No   Sexual activity: Not on file

## 2023-12-12 ENCOUNTER — Other Ambulatory Visit: Payer: Self-pay | Admitting: Adult Health

## 2023-12-12 DIAGNOSIS — E8882 Obesity due to disruption of MC4R pathway: Secondary | ICD-10-CM

## 2024-01-26 ENCOUNTER — Ambulatory Visit: Admitting: Adult Health

## 2024-01-26 VITALS — BP 100/70 | HR 84 | Temp 98.0°F | Ht 71.5 in | Wt 268.0 lb

## 2024-01-26 DIAGNOSIS — Z8739 Personal history of other diseases of the musculoskeletal system and connective tissue: Secondary | ICD-10-CM | POA: Diagnosis not present

## 2024-01-26 DIAGNOSIS — E66812 Obesity, class 2: Secondary | ICD-10-CM | POA: Diagnosis not present

## 2024-01-26 DIAGNOSIS — Z6836 Body mass index (BMI) 36.0-36.9, adult: Secondary | ICD-10-CM

## 2024-01-26 MED ORDER — TIRZEPATIDE-WEIGHT MANAGEMENT 7.5 MG/0.5ML ~~LOC~~ SOLN: 7.5000 mg | SUBCUTANEOUS | 0 refills | Status: DC

## 2024-01-26 NOTE — Progress Notes (Signed)
 Subjective:    Patient ID: Steven Peters, male    DOB: March 25, 1966, 58 y.o.   MRN: [id number]  HPI 58 year old male who  has a past medical history of Allergy, Cough, Gout, History of gout (08/26/2014), Hypertension, Left hip pain (06/15/2011), Obesity, SOB (shortness of breath), and Unilateral primary osteoarthritis, left knee (04/25/2018).  He presents to the office today for follow up regarding gout, obesity weight loss management.     He does take Allopurinol  300 mg daily and has not had any gout flares in quite sometime.   He started exercising a few months ago when his weight was 303 lbs. He has been doing an exercise bike, walking and weight lifting ; he has also been eating healthy but had not been able to lose much weight. He was started on Zepbound  about two months ago ( 11/24/2023) and has been tolerating this medication well with slight nausea the day after. He has noticed that his appetite has significantly decreased and he no longer wants to drink alcohol. He has also noticed that with the weight loss his knees are no longer hurting.    He started the 7.5 mg dose a week ago.   Wt Readings from Last 5 Encounters:  01/26/24 268 lb (121.6 kg)  11/24/23 296 lb (134.3 kg)  10/16/23 297 lb (134.7 kg)  08/18/23 291 lb (132 kg)  09/02/22 290 lb (131.5 kg)    Review of Systems See HPI   Past Medical History:  Diagnosis Date   Allergy    Cough    Gout    History of gout 08/26/2014   Hypertension    Left hip pain 06/15/2011   Obesity    SOB (shortness of breath)    Unilateral primary osteoarthritis, left knee 04/25/2018    Social History   Socioeconomic History   Marital status: Married    Spouse name: Not on file   Number of children: Not on file   Years of education: Not on file   Highest education level: Some college, no degree  Occupational History   Not on file  Tobacco Use   Smoking status: Former   Smokeless tobacco: Never  Substance and Sexual Activity    Alcohol use: Yes    Alcohol/week: 2.0 standard drinks of alcohol    Types: 2 Shots of liquor per week    Comment: weekends only   Drug use: No   Sexual activity: Not on file  Other Topics Concern   Not on file  Social History Narrative   Not on file   Social Drivers of Health   Financial Resource Strain: Low Risk  (09/20/2021)   Overall Financial Resource Strain (CARDIA)    Difficulty of Paying Living Expenses: Not hard at all  Food Insecurity: No Food Insecurity (09/20/2021)   Hunger Vital Sign    Worried About Running Out of Food in the Last Year: Never true    Ran Out of Food in the Last Year: Never true  Transportation Needs: No Transportation Needs (09/20/2021)   PRAPARE - Administrator, Civil Service (Medical): No    Lack of Transportation (Non-Medical): No  Physical Activity: Insufficiently Active (09/20/2021)   Exercise Vital Sign    Days of Exercise per Week: 1 day    Minutes of Exercise per Session: 10 min  Stress: Stress Concern Present (09/20/2021)   [employer] of Occupational Health - Occupational Stress Questionnaire    Feeling of Stress : To some  extent  Social Connections: Unknown (09/20/2021)   Social Connection and Isolation Panel    Frequency of Communication with Friends and Family: More than three times a week    Frequency of Social Gatherings with Friends and Family: Twice a week    Attends Religious Services: Patient declined    Database administrator or Organizations: No    Attends Engineer, structural: Not on file    Marital Status: Married  Catering manager Violence: Not on file    Past Surgical History:  Procedure Laterality Date   KNEE ARTHROSCOPY     right knee   WISDOM TOOTH EXTRACTION      Family History  Adopted: Yes  Problem Relation Age of Onset   Colon cancer Neg Hx    Colon polyps Neg Hx     No Known Allergies  Current Outpatient Medications on File Prior to Visit  Medication Sig Dispense Refill    allopurinol  (ZYLOPRIM ) 300 MG tablet TAKE 1 TABLET(300 MG) BY MOUTH DAILY 90 tablet 3   IBUPROFEN PO Take 3 tablets by mouth daily.     tirzepatide  (MOUNJARO ) 5 MG/0.5ML Pen Inject 5 mg into the skin once a week.     INS SYRINGE/NEEDLE 1CC/28G 28G X 1/2 1 ML MISC Use one pen weekly (Patient not taking: Reported on 01/26/2024) 10 each 3   No current facility-administered medications on file prior to visit.    BP 100/70   Pulse 84   Temp 98 F (36.7 C) (Oral)   Ht 5' 11.5 (1.816 m)   Wt 268 lb (121.6 kg)   SpO2 95%   BMI 36.86 kg/m       Objective:   Physical Exam Vitals and nursing note reviewed.  Constitutional:      Appearance: Normal appearance. He is obese.  Cardiovascular:     Rate and Rhythm: Normal rate and regular rhythm.     Pulses: Normal pulses.     Heart sounds: Normal heart sounds.  Pulmonary:     Effort: Pulmonary effort is normal.     Breath sounds: Normal breath sounds.  Skin:    General: Skin is warm and dry.  Neurological:     General: No focal deficit present.     Mental Status: He is alert and oriented to person, place, and time.  Psychiatric:        Mood and Affect: Mood normal.        Behavior: Behavior normal.        Thought Content: Thought content normal.        Judgment: Judgment normal.       Assessment & Plan:  1. History of gout (Primary) - Continue with Allopurinol  300 mg daily.   2. Class 2 obesity (Primary) - He has lost 28 pounds since starting Mounjaro ; some of this weight loss can be contributed to not drinking alcohol any longer and being more active. I am going to keep him on the 7.5 mg dose for the next 3 months.  - encouraged small meals throughout the day and to stay hydrated - tirzepatide  7.5 MG/0.5ML injection vial; Inject 7.5 mg into the skin once a week.  Dispense: 6 mL; Refill: 0  3. BMI 36.0-36.9,adult  - tirzepatide  7.5 MG/0.5ML injection vial; Inject 7.5 mg into the skin once a week.  Dispense: 6 mL; Refill:  0  Darleene Shape, NP

## 2024-02-24 ENCOUNTER — Encounter: Payer: Self-pay | Admitting: Adult Health

## 2024-02-27 ENCOUNTER — Other Ambulatory Visit: Payer: Self-pay | Admitting: Adult Health

## 2024-02-27 MED ORDER — ONDANSETRON HCL 4 MG PO TABS: 4.0000 mg | ORAL_TABLET | Freq: Three times a day (TID) | ORAL | 2 refills | Status: AC | PRN

## 2024-02-27 NOTE — Telephone Encounter (Signed)
**Note De-identified  [place] Obfuscation** Please advise 

## 2024-03-08 ENCOUNTER — Ambulatory Visit (INDEPENDENT_AMBULATORY_CARE_PROVIDER_SITE_OTHER): Admitting: Family Medicine

## 2024-03-08 ENCOUNTER — Ambulatory Visit: Payer: Self-pay

## 2024-03-08 ENCOUNTER — Encounter: Payer: Self-pay | Admitting: Family Medicine

## 2024-03-08 VITALS — BP 112/80 | HR 98 | Temp 97.3°F | Ht 71.5 in | Wt 256.8 lb

## 2024-03-08 DIAGNOSIS — K5903 Drug induced constipation: Secondary | ICD-10-CM | POA: Diagnosis not present

## 2024-03-08 DIAGNOSIS — Z6835 Body mass index (BMI) 35.0-35.9, adult: Secondary | ICD-10-CM

## 2024-03-08 DIAGNOSIS — E669 Obesity, unspecified: Secondary | ICD-10-CM | POA: Diagnosis not present

## 2024-03-08 NOTE — Progress Notes (Signed)
 Phone [phone] In person visit   Subjective:   Steven Peters is a 58 y.o. year old very pleasant male patient who presents for/with See problem oriented charting Chief Complaint  Patient presents with   Constipation    Started zepbound  in June, Constipation started x4 days ago,    My staff discussed the use of AI scribe software for clinical note transcription with the patient, who gave verbal consent to proceed.  History of Present Illness   Steven Peters is a 58 year old male who presents with constipation after starting Zepbound  for weight loss.  He started Zepbound  in June for weight loss and is currently on a 7.5 mg dose. He has experienced significant weight loss, approximately 40 pounds since May!  He developed constipation four days ago, which has been a gradual onset. Prior to starting Zepbound , he had daily bowel movements, but since starting the medication, his bowel movements have become less frequent, occurring every other day, and have become more firm. In the last week, he has not had a significant bowel movement at all. In last few days he experiences the sensation of needing to have a bowel movement, but it is painful and feels like 'a bowling ball'. He has been passing small 'balls' of stool and some gas, but no significant bowel movement.  He has attempted various treatments for constipation, including taking Miralax one capful daily for three days, Dulcolax chewables, and Colace, but these have not been effective. He also tried a Fleet enema this morning without success-we discussed how he did this and it was improper though and I instructed him when appropriate usage  He reports not drinking enough fluids, particularly water, due to his travel schedule. He drinks three bottles of electrolyte drinks daily but not much water at all otherwise  No abdominal pain, nausea, or vomiting. He has been exercising more frequently lately and remains active despite constipation  setback.        Past Medical History-  Patient Active Problem List   Diagnosis Date Noted   Unilateral primary osteoarthritis, right knee 05/22/2023   Unilateral primary osteoarthritis, left knee 04/25/2018   History of gout 08/26/2014   Routine general medical examination at a health care facility 08/26/2014    Medications- reviewed and updated Current Outpatient Medications  Medication Sig Dispense Refill   allopurinol  (ZYLOPRIM ) 300 MG tablet TAKE 1 TABLET(300 MG) BY MOUTH DAILY 90 tablet 3   ondansetron  (ZOFRAN ) 4 MG tablet Take 1 tablet (4 mg total) by mouth every 8 (eight) hours as needed for nausea or vomiting. 20 tablet 2   tirzepatide  7.5 MG/0.5ML injection vial Inject 7.5 mg into the skin once a week. 6 mL 0   INS SYRINGE/NEEDLE 1CC/28G 28G X 1/2 1 ML MISC Use one pen weekly (Patient not taking: Reported on 03/08/2024) 10 each 3   No current facility-administered medications for this visit.     Objective:  BP 112/80 (BP Location: Left Arm, Patient Position: Sitting, Cuff Size: Normal)   Pulse 98   Temp (!) 97.3 F (36.3 C) (Temporal)   Ht 5' 11.5 (1.816 m)   Wt 256 lb 12.8 oz (116.5 kg)   SpO2 98%   BMI 35.32 kg/m  Gen: NAD, resting comfortably CV: RRR though high normal heart rate-no murmurs rubs or gallops Lungs: CTAB no crackles, wheeze, rhonchi Abdomen: soft/nontender/nondistended/normal bowel sounds. No rebound or guarding.  Ext: no edema Skin: warm, dry     Assessment and Plan      #  Constipation -Starting with Zepbound  use in June, with recent worsening over the past four days. No significant bowel movement in the last week-though has had some hard balls of stool passed and is still passing gas.  No abdominal pain, nausea, or vomiting.  Issue likely likely related to decreased fluid intake and medication side effects. -He declines rectal exam today and potential disimpaction - Increase water intake to at least 80 ounces per day. - Take a second dose  of Miralax tonight. - Increase Miralax to three times a day  - Repeat Fleet enema if no bowel movement by midday tomorrow. - Continue three doses of Miralax on Sunday if no bowel movement. - Follow up on Monday if no bowel movement, especially with upcoming flight on Tuesday. - Seek immediate care if experiencing nausea, vomiting, or abdominal pain. - Consider rectal exam if conservative measures fail since he declined this today.  # Obesity -Significant weight loss of 40 pounds since May with the use of Zepbound . Current dose is 7.5 mg since June 11th. Effective for weight loss but causing constipation. Discussed the importance of hydration as part of the medication regimen. - Continue Zepbound  at current dose. - Ensure adequate hydration as part of the medication regimen.   -We discussed bmet has a good bowel movement needs to keep up fluid intake and perhaps 2 1 capful of MiraLAX daily for the next 2 weeks as long as no loose stools  Recommended follow up: Return for as needed for new, worsening, persistent symptoms. Future Appointments  Date Time Provider Department Center  04/26/2024  8:30 AM Nafziger, Darleene, NP [facility] [address]    Lab/Order associations:   ICD-10-CM   1. Drug-induced constipation  K59.03     2. BMI 35.0-35.9,adult  Z68.35     3. Obesity (BMI 30-39.9)  E66.9      I personally spent a total of 30 minutes in the care of the patient today including preparing to see the patient, getting/reviewing separately obtained history, performing a medically appropriate exam/evaluation, counseling and educating including going over appropriate use of Fleet enema and emphasized the importance of water intake as well as risks of artificial sugars including potential constipation, and documenting clinical information in the EHR.   Return precautions advised.  Garnette Lukes, MD

## 2024-03-08 NOTE — Telephone Encounter (Signed)
 FYI Only or Action Required?: FYI only for provider.  Patient was last seen in primary care on 01/26/2024 by Merna Huxley, NP.  Called Nurse Triage reporting Constipation.  Symptoms began a week ago.  Interventions attempted: OTC medications: Enema, miralax, Colace.  Symptoms are: gradually worsening.  Triage Disposition: See HCP Within 4 Hours (Or PCP Triage)  Patient/caregiver understands and will follow disposition?: Yes        Copied from CRM [id number]. Topic: Clinical - Red Word Triage >> Mar 08, 2024 11:45 AM [place] E wrote: [facility] that prompted transfer to Nurse Triage: Side effect from tirzepatide  7.5 MG/0.5ML injection vial. Constipation going on for a few days. Reason for Disposition  [1] Rectal pain or fullness from fecal impaction (rectum full of stool) AND [2] NOT better after SITZ bath, suppository or enema  Answer Assessment - Initial Assessment Questions Patient states he has had constipation for the last week. Using a stimulant laxative, Colace, and enema for symptoms. He states he is scared to leave the house due to symptoms.   1. STOOL PATTERN OR FREQUENCY: How often do you have a bowel movement (BM)?  (Normal range: 3 times a day to every 3 days)  When was your last BM?       Last normal BM 1 week ago.  2. STRAINING: Do you have to strain to have a BM?      Yes  3. ONSET: When did the constipation begin?     A couple days ago  4. RECTAL PAIN: Does your rectum hurt when the stool comes out? If Yes, ask: Do you have hemorrhoids? How bad is the pain?  (Scale 1-10; or mild, moderate, severe)     Severe  5. BM COMPOSITION: Are the stools hard?      Yes  6. BLOOD ON STOOLS: Has there been any blood on the toilet tissue or on the surface of the BM? If Yes, ask: When was the last time?     Denies  7. CHRONIC CONSTIPATION: Is this a new problem for you?  If No, ask: How long have you had this problem? (days, weeks, months)      Patient  states this is a new problem for him   8. CHANGES IN DIET OR HYDRATION: Have there been any recent changes in your diet? How much fluids are you drinking on a daily basis?  How much have you had to drink today?     Not drinking as much water lately   9. MEDICINES: Have you been taking any new medicines? Are you taking any narcotic pain medicines? (e.g., Dilaudid, morphine, Percocet, Vicodin)     No  10. LAXATIVES: Have you been using any stool softeners, laxatives, or enemas?  If Yes, ask What are you using, how often, and when was the last time?       Stimulant laxative , miralax  this morning, an enema.  11. OTHER SYMPTOMS: Do you have any other symptoms? (e.g., abdomen pain, bloating, fever, vomiting)       Bloating  Protocols used: Constipation-A-AH

## 2024-03-08 NOTE — Patient Instructions (Addendum)
 Try to switch over to water consistently and at least right now push for 80 oz a day  Take a 2nd dose of miralax tonight. As well as up to 3 times a day tomorrow. If no bowel movement by mid day tomorrow with these efforts can repeat your fleet enema midday.   Would then do 3 doses again on Sunday if no bowel movement and follow up with [place] on Monday if no bowel movement especially with upcoming flight  If nausea, vomiting, abdominal pain seek care immediately  Continue to hydrate even after bowel movement and probably for another week or two at least stay on 1 capful a day to keep things flowing.   Recommended follow up: Return for as needed for new, worsening, persistent symptoms.

## 2024-03-08 NOTE — Telephone Encounter (Signed)
 Left detailed message advising pt to call back to schedule an appt. As well as advising UC since it is the weekend.

## 2024-04-03 ENCOUNTER — Other Ambulatory Visit: Payer: Self-pay | Admitting: Adult Health

## 2024-04-03 DIAGNOSIS — E66812 Obesity, class 2: Secondary | ICD-10-CM

## 2024-04-03 DIAGNOSIS — Z6836 Body mass index (BMI) 36.0-36.9, adult: Secondary | ICD-10-CM

## 2024-04-03 NOTE — Telephone Encounter (Signed)
 Okay for refill?  Not sure when pt needs to return

## 2024-04-26 ENCOUNTER — Encounter: Payer: Self-pay | Admitting: Adult Health

## 2024-04-26 ENCOUNTER — Ambulatory Visit: Admitting: Adult Health

## 2024-04-26 VITALS — BP 106/60 | HR 75 | Temp 97.5°F | Ht 71.5 in | Wt 250.0 lb

## 2024-04-26 DIAGNOSIS — E66811 Obesity, class 1: Secondary | ICD-10-CM

## 2024-04-26 DIAGNOSIS — Z6834 Body mass index (BMI) 34.0-34.9, adult: Secondary | ICD-10-CM | POA: Diagnosis not present

## 2024-04-26 MED ORDER — TIRZEPATIDE-WEIGHT MANAGEMENT 10 MG/0.5ML ~~LOC~~ SOLN: 10.0000 mg | SUBCUTANEOUS | 3 refills | Status: AC

## 2024-04-26 NOTE — Progress Notes (Signed)
 Subjective:    Patient ID: Steven Peters, male    DOB: Mar 17, 1966, 58 y.o.   MRN: [id number]  HPI 58 year old male who  has a past medical history of Allergy, Cough, Gout, History of gout (08/26/2014), Hypertension, Left hip pain (06/15/2011), Obesity, SOB (shortness of breath), and Unilateral primary osteoarthritis, left knee (04/25/2018).   He presents to the office today for follow up regarding obesity and weight loss management.   He started exercising a few months ago when his weight was 303 lbs. He has been doing an exercise bike, walking and weight lifting ; he has also been eating healthy but had not been able to lose much weight. He was started on Zepbound  on   11/24/2023 and has been tolerating this medication well with slight nausea the day after. He did have constipation but since increasing fluids this has not been an issue.  He has noticed that his appetite has significantly decreased and he no longer wants to drink alcohol. He has also noticed that with the weight loss his knees are no longer hurting.   Wt Readings from Last 5 Encounters:  04/26/24 250 lb (113.4 kg)  03/08/24 256 lb 12.8 oz (116.5 kg)  01/26/24 268 lb (121.6 kg)  11/24/23 296 lb (134.3 kg)  10/16/23 297 lb (134.7 kg)    Review of Systems See HPI   Past Medical History:  Diagnosis Date   Allergy    Cough    Gout    History of gout 08/26/2014   Hypertension    Left hip pain 06/15/2011   Obesity    SOB (shortness of breath)    Unilateral primary osteoarthritis, left knee 04/25/2018    Social History   Socioeconomic History   Marital status: Married    Spouse name: Not on file   Number of children: Not on file   Years of education: Not on file   Highest education level: Some college, no degree  Occupational History   Not on file  Tobacco Use   Smoking status: Former   Smokeless tobacco: Never  Substance and Sexual Activity   Alcohol use: Yes    Alcohol/week: 2.0 standard drinks of alcohol     Types: 2 Shots of liquor per week    Comment: weekends only   Drug use: No   Sexual activity: Not on file  Other Topics Concern   Not on file  Social History Narrative   Not on file   Social Drivers of Health   Financial Resource Strain: Low Risk  (09/20/2021)   Overall Financial Resource Strain (CARDIA)    Difficulty of Paying Living Expenses: Not hard at all  Food Insecurity: No Food Insecurity (09/20/2021)   Hunger Vital Sign    Worried About Running Out of Food in the Last Year: Never true    Ran Out of Food in the Last Year: Never true  Transportation Needs: No Transportation Needs (09/20/2021)   PRAPARE - Administrator, Civil Service (Medical): No    Lack of Transportation (Non-Medical): No  Physical Activity: Insufficiently Active (09/20/2021)   Exercise Vital Sign    Days of Exercise per Week: 1 day    Minutes of Exercise per Session: 10 min  Stress: Stress Concern Present (09/20/2021)   [employer] of Occupational Health - Occupational Stress Questionnaire    Feeling of Stress : To some extent  Social Connections: Unknown (09/20/2021)   Social Connection and Isolation Panel    Frequency  of Communication with Friends and Family: More than three times a week    Frequency of Social Gatherings with Friends and Family: Twice a week    Attends Religious Services: Patient declined    Database administrator or Organizations: No    Attends Engineer, structural: Not on file    Marital Status: Married  Catering manager Violence: Not on file    Past Surgical History:  Procedure Laterality Date   KNEE ARTHROSCOPY     right knee   WISDOM TOOTH EXTRACTION      Family History  Adopted: Yes  Problem Relation Age of Onset   Colon cancer Neg Hx    Colon polyps Neg Hx     No Known Allergies  Current Outpatient Medications on File Prior to Visit  Medication Sig Dispense Refill   allopurinol  (ZYLOPRIM ) 300 MG tablet TAKE 1 TABLET(300 MG) BY MOUTH  DAILY 90 tablet 3   INS SYRINGE/NEEDLE 1CC/28G 28G X 1/2 1 ML MISC Use one pen weekly 10 each 3   ondansetron  (ZOFRAN ) 4 MG tablet Take 1 tablet (4 mg total) by mouth every 8 (eight) hours as needed for nausea or vomiting. 20 tablet 2   ZEPBOUND  7.5 MG/0.5ML injection vial INJECT 0.5 ML (7.5 MG) UNDER THE SKIN ONCE WEEKLY (0.5ML= 50 UNITS) 6 mL 0   No current facility-administered medications on file prior to visit.    BP 106/60   Pulse 75   Temp (!) 97.5 F (36.4 C) (Oral)   Ht 5' 11.5 (1.816 m)   Wt 250 lb (113.4 kg)   SpO2 97%   BMI 34.38 kg/m       Objective:   Physical Exam Vitals and nursing note reviewed.  Constitutional:      Appearance: Normal appearance. He is obese.  Cardiovascular:     Rate and Rhythm: Normal rate and regular rhythm.     Pulses: Normal pulses.     Heart sounds: Normal heart sounds.  Pulmonary:     Effort: Pulmonary effort is normal.     Breath sounds: Normal breath sounds.  Skin:    General: Skin is warm and dry.  Neurological:     General: No focal deficit present.     Mental Status: He is alert and oriented to person, place, and time.  Psychiatric:        Mood and Affect: Mood normal.        Behavior: Behavior normal.        Thought Content: Thought content normal.        Judgment: Judgment normal.        Assessment & Plan:  1. Class 1 obesity (Primary) - He has lost 53 pounds since starting Zepbound !  - Continue to exercise, eat healthy and stay hydrated.  - He would like to increase to 10 mg weekly.  - tirzepatide  10 MG/0.5ML injection vial; Inject 10 mg into the skin once a week.  Dispense: 2 mL; Refill: 3  2. BMI 34.0-34.9,adult  - tirzepatide  10 MG/0.5ML injection vial; Inject 10 mg into the skin once a week.  Dispense: 2 mL; Refill: 3  Darleene Shape, NP

## 2024-05-03 ENCOUNTER — Ambulatory Visit: Admitting: Adult Health

## 2024-05-06 ENCOUNTER — Encounter: Payer: Self-pay | Admitting: Radiology

## 2024-06-06 ENCOUNTER — Emergency Department (HOSPITAL_COMMUNITY)

## 2024-06-06 ENCOUNTER — Other Ambulatory Visit: Payer: Self-pay

## 2024-06-06 ENCOUNTER — Encounter (HOSPITAL_COMMUNITY): Payer: Self-pay

## 2024-06-06 ENCOUNTER — Emergency Department (HOSPITAL_COMMUNITY)
Admission: EM | Admit: 2024-06-06 | Discharge: 2024-06-06 | Disposition: A | Discharge status: Home / Self Care | Attending: Emergency Medicine | Admitting: Emergency Medicine

## 2024-06-06 DIAGNOSIS — R1013 Epigastric pain: Secondary | ICD-10-CM

## 2024-06-06 LAB — COMPREHENSIVE METABOLIC PANEL WITH GFR
ALT: 29 U/L (ref 0–44)
AST: 30 U/L (ref 15–41)
Albumin: 3.7 g/dL (ref 3.5–5.0)
Alkaline Phosphatase: 52 U/L (ref 38–126)
Anion gap: 11 (ref 5–15)
BUN: 10 mg/dL (ref 6–20)
CO2: 21 mmol/L — ABNORMAL LOW (ref 22–32)
Calcium: 8.7 mg/dL — ABNORMAL LOW (ref 8.9–10.3)
Chloride: 106 mmol/L (ref 98–111)
Creatinine, Ser: 1.05 mg/dL (ref 0.61–1.24)
GFR, Estimated: >60 mL/min (ref >60)
Glucose, Bld: 82 mg/dL (ref 70–99)
Potassium: 4.3 mmol/L (ref 3.5–5.1)
Sodium: 138 mmol/L (ref 135–145)
Total Bilirubin: 1.3 mg/dL — ABNORMAL HIGH (ref 0.0–1.2)
Total Protein: 6.6 g/dL (ref 6.5–8.1)

## 2024-06-06 LAB — CBC WITH DIFFERENTIAL/PLATELET
Abs Immature Granulocytes: 0.05 K/uL (ref 0.00–0.07)
Basophils Absolute: 0.1 K/uL (ref 0.0–0.1)
Basophils Relative: 1 %
Eosinophils Absolute: 0.2 K/uL (ref 0.0–0.5)
Eosinophils Relative: 2 %
HCT: 50.4 % (ref 39.0–52.0)
Hemoglobin: 17.8 g/dL — ABNORMAL HIGH (ref 13.0–17.0)
Immature Granulocytes: 1 %
Lymphocytes Relative: 10 %
Lymphs Abs: 1.1 K/uL (ref 0.7–4.0)
MCH: 30.5 pg (ref 26.0–34.0)
MCHC: 35.3 g/dL (ref 30.0–36.0)
MCV: 86.3 fL (ref 80.0–100.0)
Monocytes Absolute: 0.7 K/uL (ref 0.1–1.0)
Monocytes Relative: 6 %
Neutro Abs: 8.7 K/uL — ABNORMAL HIGH (ref 1.7–7.7)
Neutrophils Relative %: 80 %
Platelets: 264 K/uL (ref 150–400)
RBC: 5.84 MIL/uL — ABNORMAL HIGH (ref 4.22–5.81)
RDW: 14.1 % (ref 11.5–15.5)
WBC: 10.7 K/uL — ABNORMAL HIGH (ref 4.0–10.5)
nRBC: 0 % (ref 0.0–0.2)

## 2024-06-06 LAB — TROPONIN I (HIGH SENSITIVITY)
Troponin I (High Sensitivity): <2 ng/L (ref <18)
Troponin I (High Sensitivity): <2 ng/L (ref <18)

## 2024-06-06 LAB — LIPASE, BLOOD: Lipase: 50 U/L (ref 11–51)

## 2024-06-06 MED ORDER — IOHEXOL 350 MG/ML SOLN
100.0000 mL | Freq: Once | INTRAVENOUS | Status: AC | PRN
  Administered 2024-06-06: 100 mL via INTRAVENOUS

## 2024-06-06 MED ORDER — PANTOPRAZOLE SODIUM 40 MG PO TBEC: 40.0000 mg | DELAYED_RELEASE_TABLET | Freq: Two times a day (BID) | ORAL | 0 refills | Status: AC

## 2024-06-06 MED ORDER — FAMOTIDINE IN NACL 20-0.9 MG/50ML-% IV SOLN
20.0000 mg | Freq: Once | INTRAVENOUS | Status: AC
  Administered 2024-06-06: 20 mg via INTRAVENOUS
  Filled 2024-06-06: qty 50

## 2024-06-06 MED ORDER — ONDANSETRON HCL 4 MG/2ML IJ SOLN
4.0000 mg | Freq: Once | INTRAMUSCULAR | Status: AC
  Administered 2024-06-06: 4 mg via INTRAVENOUS
  Filled 2024-06-06 (×2): qty 2

## 2024-06-06 MED ORDER — SODIUM CHLORIDE 0.9 % IV BOLUS
500.0000 mL | Freq: Once | INTRAVENOUS | Status: AC
  Administered 2024-06-06: 500 mL via INTRAVENOUS

## 2024-06-06 MED ORDER — FAMOTIDINE 20 MG PO TABS: 20.0000 mg | ORAL_TABLET | Freq: Two times a day (BID) | ORAL | 0 refills | Status: AC

## 2024-06-06 MED ORDER — SUCRALFATE 1 GM/10ML PO SUSP: 1.0000 g | Freq: Three times a day (TID) | ORAL | 0 refills | Status: AC

## 2024-06-06 MED ORDER — HYDROMORPHONE HCL 1 MG/ML IJ SOLN
1.0000 mg | Freq: Once | INTRAMUSCULAR | Status: AC
  Administered 2024-06-06: 1 mg via INTRAVENOUS
  Filled 2024-06-06: qty 1

## 2024-06-06 MED ORDER — MORPHINE SULFATE (PF) 4 MG/ML IV SOLN
4.0000 mg | Freq: Once | INTRAVENOUS | Status: AC
  Administered 2024-06-06: 4 mg via INTRAVENOUS
  Filled 2024-06-06: qty 1

## 2024-06-06 MED ORDER — MORPHINE SULFATE (PF) 4 MG/ML IV SOLN
4.0000 mg | Freq: Once | INTRAVENOUS | Status: DC
  Filled 2024-06-06: qty 1

## 2024-06-06 MED ORDER — HYDROCODONE-ACETAMINOPHEN 5-325 MG PO TABS: 1.0000 | ORAL_TABLET | Freq: Four times a day (QID) | ORAL | 0 refills | Status: AC | PRN

## 2024-06-06 MED ORDER — ALUM & MAG HYDROXIDE-SIMETH 200-200-20 MG/5ML PO SUSP
30.0000 mL | Freq: Once | ORAL | Status: AC
  Administered 2024-06-06: 30 mL via ORAL
  Filled 2024-06-06: qty 30

## 2024-06-06 NOTE — ED Triage Notes (Signed)
 Pt BIB [facility] from home for CP that began at 6am this morning. Pt reports pain is sharp, centralized, does not radiate, and is intermittent. 324 ASA given by EMS, no cardiac hx, VSS per EMS.

## 2024-06-06 NOTE — Discharge Instructions (Addendum)
 You can use over-the-counter Maalox or similar medication as needed for breakthrough symptoms.  Begin taking Protonix (pantoprazole) twice daily at least 30 minutes before breakfast and dinner/bedtime.  For the first 5 days you can combine this with Pepcid as it takes a few days for Protonix to start working and this will help bridge the therapy.  You can also take Carafate daily before meals and bedtime to help reduce pain and symptoms.  I have prescribed a few tablets of hydrocodone  for severe breakthrough pain, only take these if no other medications have worked and do not drive or operate machinery while taking this medication.  Please call to schedule follow-up appointments with [facility] as well as with your primary care doctor.  If you develop worsening abdominal pain, blood in your vomit or stools, or other new or concerning symptoms return for reevaluation.

## 2024-06-06 NOTE — ED Provider Notes (Signed)
  [place] EMERGENCY DEPARTMENT AT [facility] Provider Note   CSN: [id number] Arrival date & time: 06/06/24  9157     Patient presents with: Chest Pain   Steven Peters is a 58 y.o. male.  {Add pertinent medical, surgical, social history, OB history to HPI:32947}  Chest Pain      Prior to Admission medications   Medication Sig Start Date End Date Taking? Authorizing Provider  allopurinol  (ZYLOPRIM ) 300 MG tablet TAKE 1 TABLET(300 MG) BY MOUTH DAILY 08/18/23   Nafziger, Darleene, NP  INS SYRINGE/NEEDLE 1CC/28G 28G X 1/2 1 ML MISC Use one pen weekly 11/24/23   Nafziger, Cory, NP  ondansetron  (ZOFRAN ) 4 MG tablet Take 1 tablet (4 mg total) by mouth every 8 (eight) hours as needed for nausea or vomiting. 02/27/24   Merna Darleene, NP  tirzepatide  10 MG/0.5ML injection vial Inject 10 mg into the skin once a week. 04/26/24 07/25/24  Merna Darleene, NP    Allergies: Patient has no known allergies.    Review of Systems  Cardiovascular:  Positive for chest pain.    Updated Vital Signs BP 133/89 (BP Location: Right Arm)   Pulse 87   Resp 18   Ht 5' 11 (1.803 m)   Wt 108.9 kg   SpO2 100%   BMI 33.47 kg/m   Physical Exam  (all labs ordered are listed, but only abnormal results are displayed) Labs Reviewed  COMPREHENSIVE METABOLIC PANEL WITH GFR  CBC WITH DIFFERENTIAL/PLATELET  LIPASE, BLOOD    EKG: None  Radiology: No results found.  {Document cardiac monitor, telemetry assessment procedure when appropriate:32947} Procedures   Medications Ordered in the ED - No data to display    {Click here for ABCD2, HEART and other calculators REFRESH Note before signing:1}                              Medical Decision Making Amount and/or Complexity of Data Reviewed Labs: ordered. Radiology: ordered.   ***  {Document critical care time when appropriate  Document review of labs and clinical decision tools ie CHADS2VASC2, etc  Document your independent review of  radiology images and any outside records  Document your discussion with family members, caretakers and with consultants  Document social determinants of health affecting pt's care  Document your decision making why or why not admission, treatments were needed:32947:::1}   Final diagnoses:  None    ED Discharge Orders     None

## 2024-06-07 ENCOUNTER — Ambulatory Visit: Admitting: Adult Health

## 2024-06-07 ENCOUNTER — Encounter: Payer: Self-pay | Admitting: Adult Health

## 2024-06-07 VITALS — BP 138/80 | HR 92 | Temp 98.2°F | Ht 71.0 in | Wt 239.0 lb

## 2024-06-07 DIAGNOSIS — R1013 Epigastric pain: Secondary | ICD-10-CM

## 2024-06-07 NOTE — Progress Notes (Signed)
 Subjective:    Patient ID: Steven Peters, male    DOB: September 10, 1965, 58 y.o.   MRN: [id number]  HPI Discussed the use of AI scribe software for clinical note transcription with the patient, who gave verbal consent to proceed.  History of Present Illness   Steven Peters is a 58 year old male who presents for follow up after being seen in the ER.   He developed severe chest pain around 5:00-5:30 AM yesterday morning that started dull and then became sharp and intense. Walking and drinking water did not help, and the pain became so severe that he was curled up on the floor.  Because of concern for a heart attack he went to the ER by ambulance. Serial troponins, lipase, CMP, chest X-ray, and CT angiography of the chest and abdomen were all normal. He was discharged on Pepcid  20 mg twice daily, Protonix  40 mg twice daily, Carafate , and Norco for severe pain, with instructions to follow up.    He has taken ibuprofen four times daily for years and thinks this may be contributing. He has no change in bowel movements.        Review of Systems See HPI   Past Medical History:  Diagnosis Date   Allergy    Cough    Gout    History of gout 08/26/2014   Hypertension    Left hip pain 06/15/2011   Obesity    SOB (shortness of breath)    Unilateral primary osteoarthritis, left knee 04/25/2018    Social History   Socioeconomic History   Marital status: Married    Spouse name: Not on file   Number of children: Not on file   Years of education: Not on file   Highest education level: Some college, no degree  Occupational History   Not on file  Tobacco Use   Smoking status: Former   Smokeless tobacco: Never  Substance and Sexual Activity   Alcohol use: Yes    Alcohol/week: 2.0 standard drinks of alcohol    Types: 2 Shots of liquor per week    Comment: weekends only   Drug use: No   Sexual activity: Not on file  Other Topics Concern   Not on file  Social History Narrative   Not on  file   Social Drivers of Health   Financial Resource Strain: Low Risk  (09/20/2021)   Overall Financial Resource Strain (CARDIA)    Difficulty of Paying Living Expenses: Not hard at all  Food Insecurity: No Food Insecurity (09/20/2021)   Hunger Vital Sign    Worried About Running Out of Food in the Last Year: Never true    Ran Out of Food in the Last Year: Never true  Transportation Needs: No Transportation Needs (09/20/2021)   PRAPARE - Administrator, Civil Service (Medical): No    Lack of Transportation (Non-Medical): No  Physical Activity: Insufficiently Active (09/20/2021)   Exercise Vital Sign    Days of Exercise per Week: 1 day    Minutes of Exercise per Session: 10 min  Stress: Stress Concern Present (09/20/2021)   [employer] of Occupational Health - Occupational Stress Questionnaire    Feeling of Stress : To some extent  Social Connections: Unknown (09/20/2021)   Social Connection and Isolation Panel    Frequency of Communication with Friends and Family: More than three times a week    Frequency of Social Gatherings with Friends and Family: Twice a week    Attends  Religious Services: Patient declined    Active Member of Clubs or Organizations: No    Attends Banker Meetings: Not on file    Marital Status: Married  Catering Manager Violence: Not on file    Past Surgical History:  Procedure Laterality Date   KNEE ARTHROSCOPY     right knee   WISDOM TOOTH EXTRACTION      Family History  Adopted: Yes  Problem Relation Age of Onset   Colon cancer Neg Hx    Colon polyps Neg Hx     No Known Allergies  Current Outpatient Medications on File Prior to Visit  Medication Sig Dispense Refill   allopurinol  (ZYLOPRIM ) 300 MG tablet TAKE 1 TABLET(300 MG) BY MOUTH DAILY 90 tablet 3   famotidine  (PEPCID ) 20 MG tablet Take 1 tablet (20 mg total) by mouth 2 (two) times daily. 10 tablet 0   HYDROcodone -acetaminophen  (NORCO/VICODIN) 5-325 MG tablet  Take 1 tablet by mouth every 6 (six) hours as needed for severe pain (pain score 7-10). 6 tablet 0   INS SYRINGE/NEEDLE 1CC/28G 28G X 1/2 1 ML MISC Use one pen weekly 10 each 3   ondansetron  (ZOFRAN ) 4 MG tablet Take 1 tablet (4 mg total) by mouth every 8 (eight) hours as needed for nausea or vomiting. 20 tablet 2   pantoprazole  (PROTONIX ) 40 MG tablet Take 1 tablet (40 mg total) by mouth 2 (two) times daily before a meal. 60 tablet 0   sucralfate  (CARAFATE ) 1 GM/10ML suspension Take 10 mLs (1 g total) by mouth 4 (four) times daily -  with meals and at bedtime. 420 mL 0   tirzepatide  10 MG/0.5ML injection vial Inject 10 mg into the skin once a week. 2 mL 3   No current facility-administered medications on file prior to visit.    BP 138/80   Pulse 92   Temp 98.2 F (36.8 C) (Oral)   Ht 5' 11 (1.803 m)   Wt 239 lb (108.4 kg)   SpO2 97%   BMI 33.33 kg/m       Objective:   Physical Exam Vitals and nursing note reviewed.  Constitutional:      Appearance: Normal appearance.  Cardiovascular:     Rate and Rhythm: Normal rate and regular rhythm.     Pulses: Normal pulses.     Heart sounds: Normal heart sounds.  Pulmonary:     Effort: Pulmonary effort is normal.     Breath sounds: Normal breath sounds.  Abdominal:     General: Abdomen is flat. Bowel sounds are normal. There is no distension.     Palpations: Abdomen is soft.     Tenderness: There is no abdominal tenderness. There is no guarding or rebound.     Hernia: No hernia is present.  Musculoskeletal:        General: Normal range of motion.  Skin:    General: Skin is warm and dry.     Capillary Refill: Capillary refill takes less than 2 seconds.  Neurological:     General: No focal deficit present.     Mental Status: He is alert and oriented to person, place, and time.  Psychiatric:        Mood and Affect: Mood normal.        Behavior: Behavior normal.        Thought Content: Thought content normal.        Judgment:  Judgment normal.        Assessment & Plan:  Assessment and Plan    Abdominal pain  Peptic ulcer disease or GERD, exacerbated by ibuprofen. Symptoms improved with medication. - Continue Pepcid  20 mg twice daily for 5 days. - Continue Protonix  40 mg twice daily for 30 days. - Use Carafate  as needed for severe pain. - Avoid ibuprofen, use Tylenol  for pain if needed - Consider endoscopy if symptoms persist or recur.       Darleene Shape, NP  I personally spent a total of 31 minutes in the care of the patient today including preparing to see the patient, getting/reviewing separately obtained history, performing a medically appropriate exam/evaluation, counseling and educating, placing orders, and documenting clinical information in the EHR.

## 2024-06-24 ENCOUNTER — Ambulatory Visit: Admitting: Orthopaedic Surgery

## 2024-06-24 DIAGNOSIS — G8929 Other chronic pain: Secondary | ICD-10-CM

## 2024-06-24 DIAGNOSIS — M170 Bilateral primary osteoarthritis of knee: Secondary | ICD-10-CM | POA: Diagnosis not present

## 2024-06-24 MED ORDER — METHYLPREDNISOLONE ACETATE 40 MG/ML IJ SUSP
40.0000 mg | INTRAMUSCULAR | Status: AC | PRN
  Administered 2024-06-24: 40 mg via INTRA_ARTICULAR

## 2024-06-24 MED ADMIN — Lidocaine HCl Local Inj 1%: 3 mL | NDC 00409427617

## 2024-06-24 NOTE — Progress Notes (Signed)
 [name] is a 58 year old that have known for many decades now.  We grew up together.  I have seen him for some time now as a relates to bilateral knee pain and arthritis.  He comes in from time to time for steroid injections in his knees as well as aspirations and has had hyaluronic acid in the past as well.  It has been 6 months since he has had any type of injections in his knees and met that visit I believe it was steroids in both knees.  He comes in today with continued bilateral knee pain.  He is someone who also travels quite a bit on long plane flights across the country and out of the country.  He has also lost at least 60 pounds and that is taking weight off his knees and he feels better from that.  He is requesting an aspiration and steroid injection of both knees today.  He is on a GLP-1 now and that is helping quite a bit.  Examination of both knees show mild effusion of both knees.  He has good range of motion overall with some global tenderness as a relates to well-known osteoarthritis of both knees.  I did aspirate about 15 cc of fluid from his right knee and 30 to 40 cc of fluid from his left knee and then placed a steroid injection of both knees per his request which he tolerated well.  He knows he can come anytime for these types of injections at least 3 months apart and we can discuss knee replacement surgery at any time when this does not provide relief.    Procedure Note  Patient: Steven Peters             Date of Birth: 1966/02/08           MRN: [id number]             Visit Date: 06/24/2024  Procedures: Visit Diagnoses:  1. Chronic pain of left knee   2. Chronic pain of right knee     Large Joint Inj: R knee on 06/24/2024 9:28 AM Indications: diagnostic evaluation and pain Details: 22 G 1.5 in needle, superolateral approach  Arthrogram: No  Medications: 3 mL lidocaine  1 %; 40 mg methylPREDNISolone  acetate 40 MG/ML Outcome: tolerated well, no immediate  complications Procedure, treatment alternatives, risks and benefits explained, specific risks discussed. Consent was given by the patient. Immediately prior to procedure a time out was called to verify the correct patient, procedure, equipment, support staff and site/side marked as required. Patient was prepped and draped in the usual sterile fashion.    Large Joint Inj: L knee on 06/24/2024 9:28 AM Indications: diagnostic evaluation and pain Details: 22 G 1.5 in needle, superolateral approach  Arthrogram: No  Medications: 3 mL lidocaine  1 %; 40 mg methylPREDNISolone  acetate 40 MG/ML Outcome: tolerated well, no immediate complications Procedure, treatment alternatives, risks and benefits explained, specific risks discussed. Consent was given by the patient. Immediately prior to procedure a time out was called to verify the correct patient, procedure, equipment, support staff and site/side marked as required. Patient was prepped and draped in the usual sterile fashion.

## 2024-07-26 ENCOUNTER — Encounter: Payer: Self-pay | Admitting: Adult Health

## 2024-07-26 NOTE — Telephone Encounter (Signed)
 Please advise.

## 2024-08-23 ENCOUNTER — Encounter: Admitting: Adult Health

## 2024-08-27 ENCOUNTER — Encounter: Admitting: Adult Health
